# Patient Record
Sex: Female | Born: 1960 | Race: Black or African American | Hispanic: No | Marital: Single | Smoking: Never smoker
Health system: Southern US, Community
[De-identification: ages and names within clinical notes are randomized; demographics above are authoritative.]

## PROBLEM LIST (undated history)

## (undated) DIAGNOSIS — I10 Essential (primary) hypertension: Secondary | ICD-10-CM

## (undated) DIAGNOSIS — E785 Hyperlipidemia, unspecified: Secondary | ICD-10-CM

## (undated) DIAGNOSIS — E119 Type 2 diabetes mellitus without complications: Secondary | ICD-10-CM

## (undated) HISTORY — DX: Type 2 diabetes mellitus without complications: E11.9

## (undated) HISTORY — DX: Essential (primary) hypertension: I10

## (undated) HISTORY — PX: ABDOMINAL HYSTERECTOMY: SHX81

## (undated) HISTORY — DX: Hyperlipidemia, unspecified: E78.5

---

## 1998-02-26 ENCOUNTER — Other Ambulatory Visit: Admission: RE | Admit: 1998-02-26 | Discharge: 1998-02-26 | Payer: Self-pay | Admitting: Gynecology

## 1999-04-17 ENCOUNTER — Other Ambulatory Visit: Admission: RE | Admit: 1999-04-17 | Discharge: 1999-04-17 | Payer: Self-pay | Admitting: Gynecology

## 2001-03-11 ENCOUNTER — Other Ambulatory Visit: Admission: RE | Admit: 2001-03-11 | Discharge: 2001-03-11 | Payer: Self-pay | Admitting: Gynecology

## 2004-09-09 ENCOUNTER — Other Ambulatory Visit: Admission: RE | Admit: 2004-09-09 | Discharge: 2004-09-09 | Payer: Self-pay | Admitting: Gynecology

## 2004-11-01 ENCOUNTER — Ambulatory Visit: Payer: Self-pay | Admitting: Internal Medicine

## 2008-12-06 ENCOUNTER — Encounter (INDEPENDENT_AMBULATORY_CARE_PROVIDER_SITE_OTHER): Payer: Self-pay | Admitting: General Surgery

## 2008-12-06 ENCOUNTER — Ambulatory Visit (HOSPITAL_COMMUNITY): Admission: RE | Admit: 2008-12-06 | Discharge: 2008-12-06 | Payer: Self-pay | Admitting: General Surgery

## 2009-11-27 ENCOUNTER — Emergency Department (HOSPITAL_BASED_OUTPATIENT_CLINIC_OR_DEPARTMENT_OTHER): Admission: EM | Admit: 2009-11-27 | Discharge: 2009-11-27 | Payer: Self-pay | Admitting: Emergency Medicine

## 2010-10-22 LAB — URINALYSIS, ROUTINE W REFLEX MICROSCOPIC
Glucose, UA: NEGATIVE mg/dL
Protein, ur: NEGATIVE mg/dL
Specific Gravity, Urine: 1.03 (ref 1.005–1.030)
pH: 6 (ref 5.0–8.0)

## 2010-11-12 LAB — DIFFERENTIAL
Basophils Absolute: 0 10*3/uL (ref 0.0–0.1)
Basophils Relative: 1 % (ref 0–1)
Eosinophils Relative: 1 % (ref 0–5)
Monocytes Absolute: 0.4 10*3/uL (ref 0.1–1.0)
Neutro Abs: 1.8 10*3/uL (ref 1.7–7.7)

## 2010-11-12 LAB — COMPREHENSIVE METABOLIC PANEL
Albumin: 3.9 g/dL (ref 3.5–5.2)
Alkaline Phosphatase: 126 U/L — ABNORMAL HIGH (ref 39–117)
BUN: 10 mg/dL (ref 6–23)
Chloride: 105 mEq/L (ref 96–112)
Potassium: 4.3 mEq/L (ref 3.5–5.1)
Total Bilirubin: 0.6 mg/dL (ref 0.3–1.2)

## 2010-11-12 LAB — CBC
HCT: 40.2 % (ref 36.0–46.0)
Hemoglobin: 13.9 g/dL (ref 12.0–15.0)
Platelets: 290 10*3/uL (ref 150–400)
RBC: 4.95 MIL/uL (ref 3.87–5.11)
WBC: 3.6 10*3/uL — ABNORMAL LOW (ref 4.0–10.5)
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
## 2012-02-09 ENCOUNTER — Emergency Department (HOSPITAL_BASED_OUTPATIENT_CLINIC_OR_DEPARTMENT_OTHER)
Admission: EM | Admit: 2012-02-09 | Discharge: 2012-02-09 | Disposition: A | Discharge status: Home / Self Care | Payer: Managed Care, Other (non HMO) | Attending: Emergency Medicine | Admitting: Emergency Medicine

## 2012-02-09 ENCOUNTER — Encounter (HOSPITAL_BASED_OUTPATIENT_CLINIC_OR_DEPARTMENT_OTHER): Payer: Self-pay | Admitting: *Deleted

## 2012-02-09 DIAGNOSIS — M545 Low back pain, unspecified: Secondary | ICD-10-CM | POA: Insufficient documentation

## 2012-02-09 DIAGNOSIS — M549 Dorsalgia, unspecified: Secondary | ICD-10-CM

## 2012-02-09 LAB — URINALYSIS, ROUTINE W REFLEX MICROSCOPIC
Bilirubin Urine: NEGATIVE
Ketones, ur: NEGATIVE mg/dL
Nitrite: NEGATIVE
Protein, ur: NEGATIVE mg/dL
Specific Gravity, Urine: 1.009 (ref 1.005–1.030)
Urobilinogen, UA: 0.2 mg/dL (ref 0.0–1.0)

## 2012-02-09 MED ORDER — IBUPROFEN 400 MG PO TABS
600.0000 mg | ORAL_TABLET | Freq: Once | ORAL | Status: AC
  Administered 2012-02-09: 600 mg via ORAL
  Filled 2012-02-09: qty 1

## 2012-02-09 MED ORDER — IBUPROFEN 600 MG PO TABS: 600.0000 mg | ORAL_TABLET | Freq: Four times a day (QID) | ORAL | Status: AC | PRN

## 2012-02-09 MED ORDER — CYCLOBENZAPRINE HCL 10 MG PO TABS: 10.0000 mg | ORAL_TABLET | Freq: Two times a day (BID) | ORAL | Status: AC | PRN

## 2012-02-09 NOTE — ED Notes (Signed)
Pt amb to room 9 with quick steady gait in nad. Pt reports frequency and left sided flank pain x Saturday, worsening since then. Pt states "either I have a bladder infection or I hurt it at the gym..." pt denies any fevers or other c/o.

## 2012-02-09 NOTE — ED Provider Notes (Signed)
History     CSN: [id number]  Arrival date & time 02/09/12  0809   First MD Initiated Contact with Patient 02/09/12 [phone]      Chief Complaint  Patient presents with  . Back Pain    (Consider location/radiation/quality/duration/timing/severity/associated sxs/prior treatment) HPI  50yoF previously healthy pw back pain. Started 3 days ago. Describes pain 8/10 left lower back. No relief with cranberry juice. Worse with moving around, bending over. Denies radiation of pain. Denies history of recent trauma/falls. Denies h/o malignancy, DM, immunocompromise  injection drug use, immunosuppression, indwelling urinary catheter, prolonged steroid use, skin or urinary tract infection. No numbness/tingling/weakness of extremities. Denies fever/chills. Denies saddle anesthesia, no urinary incontinence or retention. She is urinating more frequently than usual and "feels like it smells strong" no hematuria.  Denies rash. States she began to lifting weights including back exercises one month ago. Ambulatory with nl gait. Denies abd pain/n/v  ED Notes, ED Provider Notes from 02/09/12 0000 to 02/09/12 08:28:05       Amy Theotis Barrio, RN 02/09/2012 08:27      Pt amb to room 9 with quick steady gait in nad. Pt reports frequency and left sided flank pain x Saturday, worsening since then. Pt states "either I have a bladder infection or I hurt it at the gym..." pt denies any fevers or other c/o.    History reviewed. No pertinent past medical history.  History reviewed. No pertinent past surgical history.  History reviewed. No pertinent family history.  History  Substance Use Topics  . Smoking status: Not on file  . Smokeless tobacco: Not on file  . Alcohol Use: Not on file    OB History    Grav Para Term Preterm Abortions TAB SAB Ect Mult Living                  Review of Systems  All other systems reviewed and are negative.  except as noted HPI   Allergies  Review of patient's allergies indicates no  known allergies.  Home Medications   Current Outpatient Rx  Name Route Sig Dispense Refill  . CYCLOBENZAPRINE HCL 10 MG PO TABS Oral Take 1 tablet (10 mg total) by mouth 2 (two) times daily as needed for muscle spasms. 20 tablet 0  . IBUPROFEN 600 MG PO TABS Oral Take 1 tablet (600 mg total) by mouth every 6 (six) hours as needed for pain. 30 tablet 0    BP 115/82  Pulse 74  Temp 97.9 F (36.6 C) (Oral)  Resp 20  SpO2 100%  Physical Exam  Nursing note and vitals reviewed. Constitutional: She is oriented to person, place, and time. She appears well-developed.  HENT:  Head: Atraumatic.  Mouth/Throat: Oropharynx is clear and moist.  Eyes: Conjunctivae and EOM are normal. Pupils are equal, round, and reactive to light.  Neck: Normal range of motion. Neck supple.  Cardiovascular: Normal rate, regular rhythm, normal heart sounds and intact distal pulses.   Pulmonary/Chest: Effort normal and breath sounds normal. No respiratory distress. She has no wheezes. She has no rales.  Abdominal: Soft. She exhibits no distension. There is no tenderness. There is no rebound and no guarding.  Musculoskeletal: Normal range of motion. She exhibits no edema and no tenderness.       Strength 5/5 b/l LE No saddle anesthesia No midline c/t/l/s ttp Straight leg raise neg b/l   Neurological: She is alert and oriented to person, place, and time.  Skin: Skin is warm and  dry. No rash noted.  Psychiatric: She has a normal mood and affect.    ED Course  Procedures (including critical care time)   Labs Reviewed  URINALYSIS, ROUTINE W REFLEX MICROSCOPIC   No results found.  1. Back pain    MDM   Likely msk back pain from inc exercise/wt lifting. Neuro intact. U/A unremarkable. No EMC precluding discharge at this time. Given Precautions for return. PMD f/u.       Forbes Cellar, MD 02/09/12 0900

## 2015-07-14 ENCOUNTER — Encounter (HOSPITAL_BASED_OUTPATIENT_CLINIC_OR_DEPARTMENT_OTHER): Payer: Self-pay | Admitting: Emergency Medicine

## 2015-07-14 ENCOUNTER — Emergency Department (HOSPITAL_BASED_OUTPATIENT_CLINIC_OR_DEPARTMENT_OTHER)
Admission: EM | Admit: 2015-07-14 | Discharge: 2015-07-15 | Disposition: A | Discharge status: Home / Self Care | Payer: BLUE CROSS/BLUE SHIELD | Attending: Emergency Medicine | Admitting: Emergency Medicine

## 2015-07-14 DIAGNOSIS — R8299 Other abnormal findings in urine: Secondary | ICD-10-CM | POA: Insufficient documentation

## 2015-07-14 DIAGNOSIS — M545 Low back pain, unspecified: Secondary | ICD-10-CM

## 2015-07-14 DIAGNOSIS — R202 Paresthesia of skin: Secondary | ICD-10-CM | POA: Diagnosis not present

## 2015-07-14 DIAGNOSIS — Z87440 Personal history of urinary (tract) infections: Secondary | ICD-10-CM | POA: Insufficient documentation

## 2015-07-14 DIAGNOSIS — R350 Frequency of micturition: Secondary | ICD-10-CM | POA: Insufficient documentation

## 2015-07-14 LAB — URINALYSIS, ROUTINE W REFLEX MICROSCOPIC
Bilirubin Urine: NEGATIVE
GLUCOSE, UA: NEGATIVE mg/dL
HGB URINE DIPSTICK: NEGATIVE
Ketones, ur: NEGATIVE mg/dL
LEUKOCYTES UA: NEGATIVE
Nitrite: NEGATIVE
PROTEIN: NEGATIVE mg/dL
SPECIFIC GRAVITY, URINE: 1.015 (ref 1.005–1.030)
pH: 6.5 (ref 5.0–8.0)

## 2015-07-14 MED ORDER — NAPROXEN 500 MG PO TABS: 500.0000 mg | ORAL_TABLET | Freq: Two times a day (BID) | ORAL | Status: DC

## 2015-07-14 MED ORDER — HYDROCODONE-ACETAMINOPHEN 5-325 MG PO TABS: 1.0000 | ORAL_TABLET | Freq: Four times a day (QID) | ORAL | Status: DC | PRN

## 2015-07-14 MED ORDER — METHOCARBAMOL 500 MG PO TABS: 500.0000 mg | ORAL_TABLET | Freq: Two times a day (BID) | ORAL | Status: DC

## 2015-07-14 MED ORDER — METHOCARBAMOL 500 MG PO TABS
1000.0000 mg | ORAL_TABLET | Freq: Once | ORAL | Status: AC
  Administered 2015-07-14: 1000 mg via ORAL
  Filled 2015-07-14: qty 2

## 2015-07-14 MED ORDER — HYDROCODONE-ACETAMINOPHEN 5-325 MG PO TABS
1.0000 | ORAL_TABLET | Freq: Once | ORAL | Status: AC
  Administered 2015-07-14: 1 via ORAL
  Filled 2015-07-14: qty 1

## 2015-07-14 NOTE — Discharge Instructions (Signed)
1. Medications: robaxin, naproxyn, vicodin, usual home medications 2. Treatment: rest, drink plenty of fluids, gentle stretching as discussed, alternate ice and heat 3. Follow Up: Please followup with your primary doctor in 3 days for discussion of your diagnoses and further evaluation after today's visit; if you do not have a primary care doctor use the resource guide provided to find one;  Return to the ER for worsening back pain, difficulty walking, loss of bowel or bladder control or other concerning symptoms     Back Exercises The following exercises strengthen the muscles that help to support the back. They also help to keep the lower back flexible. Doing these exercises can help to prevent back pain or lessen existing pain. If you have back pain or discomfort, try doing these exercises 2-3 times each day or as told by your health care provider. When the pain goes away, do them once each day, but increase the number of times that you repeat the steps for each exercise (do more repetitions). If you do not have back pain or discomfort, do these exercises once each day or as told by your health care provider. EXERCISES Single Knee to Chest Repeat these steps 3-5 times for each leg: 1. Lie on your back on a firm bed or the floor with your legs extended. 2. Bring one knee to your chest. Your other leg should stay extended and in contact with the floor. 3. Hold your knee in place by grabbing your knee or thigh. 4. Pull on your knee until you feel a gentle stretch in your lower back. 5. Hold the stretch for 10-30 seconds. 6. Slowly release and straighten your leg. Pelvic Tilt Repeat these steps 5-10 times: 1. Lie on your back on a firm bed or the floor with your legs extended. 2. Bend your knees so they are pointing toward the ceiling and your feet are flat on the floor. 3. Tighten your lower abdominal muscles to press your lower back against the floor. This motion will tilt your pelvis so your  tailbone points up toward the ceiling instead of pointing to your feet or the floor. 4. With gentle tension and even breathing, hold this position for 5-10 seconds. Cat-Cow Repeat these steps until your lower back becomes more flexible: 1. Get into a hands-and-knees position on a firm surface. Keep your hands under your shoulders, and keep your knees under your hips. You may place padding under your knees for comfort. 2. Let your head hang down, and point your tailbone toward the floor so your lower back becomes rounded like the back of a cat. 3. Hold this position for 5 seconds. 4. Slowly lift your head and point your tailbone up toward the ceiling so your back forms a sagging arch like the back of a cow. 5. Hold this position for 5 seconds. Press-Ups Repeat these steps 5-10 times: 1. Lie on your abdomen (face-down) on the floor. 2. Place your palms near your head, about shoulder-width apart. 3. While you keep your back as relaxed as possible and keep your hips on the floor, slowly straighten your arms to raise the top half of your body and lift your shoulders. Do not use your back muscles to raise your upper torso. You may adjust the placement of your hands to make yourself more comfortable. 4. Hold this position for 5 seconds while you keep your back relaxed. 5. Slowly return to lying flat on the floor. Bridges Repeat these steps 10 times: 1. Lie on  your back on a firm surface. 2. Bend your knees so they are pointing toward the ceiling and your feet are flat on the floor. 3. Tighten your buttocks muscles and lift your buttocks off of the floor until your waist is at almost the same height as your knees. You should feel the muscles working in your buttocks and the back of your thighs. If you do not feel these muscles, slide your feet 1-2 inches farther away from your buttocks. 4. Hold this position for 3-5 seconds. 5. Slowly lower your hips to the starting position, and allow your buttocks  muscles to relax completely. If this exercise is too easy, try doing it with your arms crossed over your chest. Abdominal Crunches Repeat these steps 5-10 times: 1. Lie on your back on a firm bed or the floor with your legs extended. 2. Bend your knees so they are pointing toward the ceiling and your feet are flat on the floor. 3. Cross your arms over your chest. 4. Tip your chin slightly toward your chest without bending your neck. 5. Tighten your abdominal muscles and slowly raise your trunk (torso) high enough to lift your shoulder blades a tiny bit off of the floor. Avoid raising your torso higher than that, because it can put too much stress on your low back and it does not help to strengthen your abdominal muscles. 6. Slowly return to your starting position. Back Lifts Repeat these steps 5-10 times: 1. Lie on your abdomen (face-down) with your arms at your sides, and rest your forehead on the floor. 2. Tighten the muscles in your legs and your buttocks. 3. Slowly lift your chest off of the floor while you keep your hips pressed to the floor. Keep the back of your head in line with the curve in your back. Your eyes should be looking at the floor. 4. Hold this position for 3-5 seconds. 5. Slowly return to your starting position. SEEK MEDICAL CARE IF:  Your back pain or discomfort gets much worse when you do an exercise.  Your back pain or discomfort does not lessen within 2 hours after you exercise. If you have any of these problems, stop doing these exercises right away. Do not do them again unless your health care provider says that you can. SEEK IMMEDIATE MEDICAL CARE IF:  You develop sudden, severe back pain. If this happens, stop doing the exercises right away. Do not do them again unless your health care provider says that you can.   This information is not intended to replace advice given to you by your health care provider. Make sure you discuss any questions you have with your  health care provider.   Document Released: 08/28/2004 Document Revised: 04/11/2015 Document Reviewed: 09/14/2014 Elsevier Interactive Patient Education [employer].

## 2015-07-14 NOTE — ED Notes (Signed)
Pt reports left lower back pain for about 10 days, worse last night.  No known injury.

## 2015-07-14 NOTE — ED Provider Notes (Signed)
CSN: [id number]     Arrival date & time 07/14/15  1638 History   First MD Initiated Contact with Patient 07/14/15 1656     Chief Complaint  Patient presents with  . Back Pain     (Consider location/radiation/quality/duration/timing/severity/associated sxs/prior Treatment) The history is provided by the patient and medical records. No language interpreter was used.     Donna Klein is a 54 y.o. female  with a hx of abdominal hysterectomy presents to the Emergency Department complaining of gradual, persistent, progressively worsening 5/10 left lower back pain onset 10 days ago. Patient reports that her pain is worse at night when she is trying to sleep. He denies falls or known trauma. She denies night sweats, weight loss, IV drug use, anticoagulant usage or history of cancer. She reports that moving from sitting to standing makes the pain worse. Lying still makes the pain somewhat better. Patient reports that previously she has had back pain with UTIs however she denies urinary or hematuria. She does endorse some frequency and a dark color to her urine. She denies malodorous urine. Patient denies vaginal discharge. Patient reports taking Aleve with no relief. Patient reports an associated small area of paresthesia to the dorsum of the right foot without loss of sensation, foot drop, gait disturbance or paresthesia anywhere else in her legs.  Patient denies saddle anesthesia, loss of bowel or bladder control, radiation of her pain.  History reviewed. No pertinent past medical history. Past Surgical History  Procedure Laterality Date  . Abdominal hysterectomy     No family history on file. Social History  Substance Use Topics  . Smoking status: Never Smoker   . Smokeless tobacco: None  . Alcohol Use: No   OB History    No data available     Review of Systems  Constitutional: Negative for fever and fatigue.  Respiratory: Negative for chest tightness and shortness of breath.    Cardiovascular: Negative for chest pain.  Gastrointestinal: Negative for nausea, vomiting, abdominal pain and diarrhea.  Genitourinary: Negative for dysuria, urgency, frequency and hematuria.  Musculoskeletal: Positive for back pain. Negative for joint swelling, gait problem, neck pain and neck stiffness.  Skin: Negative for rash.  Neurological: Negative for weakness, light-headedness, numbness and headaches.  All other systems reviewed and are negative.     Allergies  Review of patient's allergies indicates no known allergies.  Home Medications   Prior to Admission medications   Medication Sig Start Date End Date Taking? Authorizing Provider  HYDROcodone-acetaminophen (NORCO/VICODIN) 5-325 MG tablet Take 1 tablet by mouth every 6 (six) hours as needed for moderate pain or severe pain. 07/14/15   Nakyra Bourn, PA-C  methocarbamol (ROBAXIN) 500 MG tablet Take 1 tablet (500 mg total) by mouth 2 (two) times daily. 07/14/15   Keziah Avis, PA-C  naproxen (NAPROSYN) 500 MG tablet Take 1 tablet (500 mg total) by mouth 2 (two) times daily with a meal. 07/14/15   Uriel Dowding, PA-C   BP 142/96 mmHg  Pulse 81  Temp(Src) 98 F (36.7 C) (Oral)  Resp 18  Ht 5\' 5"  (1.651 m)  Wt 73.483 kg  BMI 26.96 kg/m2  SpO2 100% Physical Exam  Constitutional: She appears well-developed and well-nourished. No distress.  HENT:  Head: Normocephalic and atraumatic.  Mouth/Throat: Oropharynx is clear and moist. No oropharyngeal exudate.  Eyes: Conjunctivae are normal.  Neck: Normal range of motion. Neck supple.  Full ROM without pain  Cardiovascular: Normal rate, regular rhythm, normal heart sounds and intact  distal pulses.   Pulmonary/Chest: Effort normal and breath sounds normal. No respiratory distress. She has no wheezes.  Abdominal: Soft. Bowel sounds are normal. She exhibits no distension and no mass. There is no tenderness.  No pulsatile mass  Musculoskeletal:  Full range of  motion of the T-spine and L-spine No tenderness to palpation of the spinous processes of the T-spine or L-spine Tenderness to palpation of the paraspinous muscles of the L-spine Patient is point tender over the left SI joint  Lymphadenopathy:    She has no cervical adenopathy.  Neurological: She is alert. She has normal reflexes.  Reflex Scores:      Bicep reflexes are 2+ on the right side and 2+ on the left side.      Brachioradialis reflexes are 2+ on the right side and 2+ on the left side.      Patellar reflexes are 2+ on the right side and 2+ on the left side.      Achilles reflexes are 2+ on the right side and 2+ on the left side. Speech is clear and goal oriented, follows commands Normal 5/5 strength in upper and lower extremities bilaterally including dorsiflexion and plantar flexion, strong and equal grip strength Sensation normal to light and sharp touch Moves extremities without ataxia, coordination intact Normal gait Normal balance No Clonus   Skin: Skin is warm and dry. No rash noted. She is not diaphoretic. No erythema.  Psychiatric: She has a normal mood and affect. Her behavior is normal.  Nursing note and vitals reviewed.   ED Course  Procedures (including critical care time) Labs Review Labs Reviewed  URINALYSIS, ROUTINE W REFLEX MICROSCOPIC (NOT AT [facility])    Imaging Review No results found. I have personally reviewed and evaluated these images and lab results as part of my medical decision-making.   EKG Interpretation None      MDM   Final diagnoses:  Left-sided low back pain without sciatica   Milus Height with nontraumatic, left lower back pain.  Patient with back pain.  No neurological deficits and normal neuro exam.  Patient can walk but states is painful.  No loss of bowel or bladder control.  No concern for cauda equina.  No fever, night sweats, weight loss, h/o cancer, IVDU.  No pulsatile abdominal masses. UA without evidence of urinary  tract infection. RICE protocol and pain medicine indicated and discussed with patient. Recommend follow-up with primary care within 48 hours.   BP 142/96 mmHg  Pulse 81  Temp(Src) 98 F (36.7 C) (Oral)  Resp 18  Ht 5\' 5"  (1.651 m)  Wt 73.483 kg  BMI 26.96 kg/m2  SpO2 100%   Abigail Butts, PA-C 07/14/15 [place], MD 07/14/15 [phone]

## 2016-05-12 ENCOUNTER — Ambulatory Visit (INDEPENDENT_AMBULATORY_CARE_PROVIDER_SITE_OTHER): Payer: BLUE CROSS/BLUE SHIELD | Admitting: Physician Assistant

## 2016-05-12 VITALS — BP 134/76 | HR 84 | Temp 98.0°F | Resp 16 | Ht 64.0 in | Wt 177.0 lb

## 2016-05-12 DIAGNOSIS — B9689 Other specified bacterial agents as the cause of diseases classified elsewhere: Secondary | ICD-10-CM | POA: Diagnosis not present

## 2016-05-12 DIAGNOSIS — N898 Other specified noninflammatory disorders of vagina: Secondary | ICD-10-CM

## 2016-05-12 DIAGNOSIS — R350 Frequency of micturition: Secondary | ICD-10-CM

## 2016-05-12 DIAGNOSIS — M25511 Pain in right shoulder: Secondary | ICD-10-CM | POA: Diagnosis not present

## 2016-05-12 DIAGNOSIS — N760 Acute vaginitis: Secondary | ICD-10-CM | POA: Diagnosis not present

## 2016-05-12 LAB — POCT URINALYSIS DIP (MANUAL ENTRY)
BILIRUBIN UA: NEGATIVE
GLUCOSE UA: NEGATIVE
Leukocytes, UA: NEGATIVE
Nitrite, UA: NEGATIVE
PH UA: 6
RBC UA: NEGATIVE
Spec Grav, UA: 1.025
Urobilinogen, UA: 0.2

## 2016-05-12 LAB — POCT WET + KOH PREP
TRICH BY WET PREP: ABSENT
YEAST BY KOH: ABSENT
YEAST BY WET PREP: ABSENT

## 2016-05-12 LAB — POC MICROSCOPIC URINALYSIS (UMFC): Mucus: ABSENT

## 2016-05-12 MED ORDER — MELOXICAM 15 MG PO TABS
15.0000 mg | ORAL_TABLET | Freq: Every day | ORAL | 1 refills | Status: DC
Start: 2016-05-12 — End: 2018-09-18

## 2016-05-12 MED ORDER — CYCLOBENZAPRINE HCL 5 MG PO TABS: 5.0000 mg | ORAL_TABLET | Freq: Three times a day (TID) | ORAL | 0 refills | Status: DC | PRN

## 2016-05-12 MED ORDER — METRONIDAZOLE 500 MG PO TABS: 500.0000 mg | ORAL_TABLET | Freq: Two times a day (BID) | ORAL | 0 refills | Status: AC

## 2016-05-12 NOTE — Patient Instructions (Addendum)
Take flagyl for BV, do not drink alcohol while on this medication.  Use meloxicam and flexeril for shoulder. Ice 4-5 times a day. Follow up in one week if pain is still present. Avoid excessive use of the shoulder. Begin exercises once pain has resolved.   Impingement Syndrome, Rotator Cuff, Bursitis With Rehab Impingement syndrome is a condition that involves inflammation of the tendons of the rotator cuff and the subacromial bursa, that causes pain in the shoulder. The rotator cuff consists of four tendons and muscles that control much of the shoulder and upper arm function. The subacromial bursa is a fluid filled sac that helps reduce friction between the rotator cuff and one of the bones of the shoulder (acromion). Impingement syndrome is usually an overuse injury that causes swelling of the bursa (bursitis), swelling of the tendon (tendonitis), and/or a tear of the tendon (strain). Strains are classified into three categories. Grade 1 strains cause pain, but the tendon is not lengthened. Grade 2 strains include a lengthened ligament, due to the ligament being stretched or partially ruptured. With grade 2 strains there is still function, although the function may be decreased. Grade 3 strains include a complete tear of the tendon or muscle, and function is usually impaired. SYMPTOMS   Pain around the shoulder, often at the outer portion of the upper arm.  Pain that gets worse with shoulder function, especially when reaching overhead or lifting.  Sometimes, aching when not using the arm.  Pain that wakes you up at night.  Sometimes, tenderness, swelling, warmth, or redness over the affected area.  Loss of strength.  Limited motion of the shoulder, especially reaching behind the back (to the back pocket or to unhook bra) or across your body.  Crackling sound (crepitation) when moving the arm.  Biceps tendon pain and inflammation (in the front of the shoulder). Worse when bending the elbow  or lifting. CAUSES  Impingement syndrome is often an overuse injury, in which chronic (repetitive) motions cause the tendons or bursa to become inflamed. A strain occurs when a force is paced on the tendon or muscle that is greater than it can withstand. Common mechanisms of injury include: Stress from sudden increase in duration, frequency, or intensity of training.  Direct hit (trauma) to the shoulder.  Aging, erosion of the tendon with normal use.  Bony bump on shoulder (acromial spur). RISK INCREASES WITH:  Contact sports (football, wrestling, boxing).  Throwing sports (baseball, tennis, volleyball).  Weightlifting and bodybuilding.  Heavy labor.  Previous injury to the rotator cuff, including impingement.  Poor shoulder strength and flexibility.  Failure to warm up properly before activity.  Inadequate protective equipment.  Old age.  Bony bump on shoulder (acromial spur). PREVENTION   Warm up and stretch properly before activity.  Allow for adequate recovery between workouts.  Maintain physical fitness:  Strength, flexibility, and endurance.  Cardiovascular fitness.  Learn and use proper exercise technique. PROGNOSIS  If treated properly, impingement syndrome usually goes away within 6 weeks. Sometimes surgery is required.  RELATED COMPLICATIONS   Longer healing time if not properly treated, or if not given enough time to heal.  Recurring symptoms, that result in a chronic condition.  Shoulder stiffness, frozen shoulder, or loss of motion.  Rotator cuff tendon tear.  Recurring symptoms, especially if activity is resumed too soon, with overuse, with a direct blow, or when using poor technique. TREATMENT  Treatment first involves the use of ice and medicine, to reduce pain and inflammation. The  use of strengthening and stretching exercises may help reduce pain with activity. These exercises may be performed at home or with a therapist. If non-surgical  treatment is unsuccessful after more than 6 months, surgery may be advised. After surgery and rehabilitation, activity is usually possible in 3 months.  MEDICATION  If pain medicine is needed, nonsteroidal anti-inflammatory medicines (aspirin and ibuprofen), or other minor pain relievers (acetaminophen), are often advised.  Do not take pain medicine for 7 days before surgery.  Prescription pain relievers may be given, if your caregiver thinks they are needed. Use only as directed and only as much as you need.  Corticosteroid injections may be given by your caregiver. These injections should be reserved for the most serious cases, because they may only be given a certain number of times. HEAT AND COLD  Cold treatment (icing) should be applied for 10 to 15 minutes every 2 to 3 hours for inflammation and pain, and immediately after activity that aggravates your symptoms. Use ice packs or an ice massage.  Heat treatment may be used before performing stretching and strengthening activities prescribed by your caregiver, physical therapist, or athletic trainer. Use a heat pack or a warm water soak. SEEK MEDICAL CARE IF:   Symptoms get worse or do not improve in 4 to 6 weeks, despite treatment.  New, unexplained symptoms develop. (Drugs used in treatment may produce side effects.) EXERCISES  RANGE OF MOTION (ROM) AND STRETCHING EXERCISES - Impingement Syndrome (Rotator Cuff  Tendinitis, Bursitis) These exercises may help you when beginning to rehabilitate your injury. Your symptoms may go away with or without further involvement from your physician, physical therapist or athletic trainer. While completing these exercises, remember:   Restoring tissue flexibility helps normal motion to return to the joints. This allows healthier, less painful movement and activity.  An effective stretch should be held for at least 30 seconds.  A stretch should never be painful. You should only feel a gentle  lengthening or release in the stretched tissue. STRETCH - Flexion, Standing  Stand with good posture. With an underhand grip on your right / left hand, and an overhand grip on the opposite hand, grasp a broomstick or cane so that your hands are a little more than shoulder width apart.  Keeping your right / left elbow straight and shoulder muscles relaxed, push the stick with your opposite hand, to raise your right / left arm in front of your body and then overhead. Raise your arm until you feel a stretch in your right / left shoulder, but before you have increased shoulder pain.  Try to avoid shrugging your right / left shoulder as your arm rises, by keeping your shoulder blade tucked down and toward your mid-back spine. Hold for __________ seconds.  Slowly return to the starting position. Repeat __________ times. Complete this exercise __________ times per day. STRETCH - Abduction, Supine  Lie on your back. With an underhand grip on your right / left hand and an overhand grip on the opposite hand, grasp a broomstick or cane so that your hands are a little more than shoulder width apart.  Keeping your right / left elbow straight and your shoulder muscles relaxed, push the stick with your opposite hand, to raise your right / left arm out to the side of your body and then overhead. Raise your arm until you feel a stretch in your right / left shoulder, but before you have increased shoulder pain.  Try to avoid shrugging your right /  left shoulder as your arm rises, by keeping your shoulder blade tucked down and toward your mid-back spine. Hold for __________ seconds.  Slowly return to the starting position. Repeat __________ times. Complete this exercise __________ times per day. ROM - Flexion, Active-Assisted  Lie on your back. You may bend your knees for comfort.  Grasp a broomstick or cane so your hands are about shoulder width apart. Your right / left hand should grip the end of the stick,  so that your hand is positioned "thumbs-up," as if you were about to shake hands.  Using your healthy arm to lead, raise your right / left arm overhead, until you feel a gentle stretch in your shoulder. Hold for __________ seconds.  Use the stick to assist in returning your right / left arm to its starting position. Repeat __________ times. Complete this exercise __________ times per day.  ROM - Internal Rotation, Supine   Lie on your back on a firm surface. Place your right / left elbow about 60 degrees away from your side. Elevate your elbow with a folded towel, so that the elbow and shoulder are the same height.  Using a broomstick or cane and your strong arm, pull your right / left hand toward your body until you feel a gentle stretch, but no increase in your shoulder pain. Keep your shoulder and elbow in place throughout the exercise.  Hold for __________ seconds. Slowly return to the starting position. Repeat __________ times. Complete this exercise __________ times per day. STRETCH - Internal Rotation  Place your right / left hand behind your back, palm up.  Throw a towel or belt over your opposite shoulder. Grasp the towel with your right / left hand.  While keeping an upright posture, gently pull up on the towel, until you feel a stretch in the front of your right / left shoulder.  Avoid shrugging your right / left shoulder as your arm rises, by keeping your shoulder blade tucked down and toward your mid-back spine.  Hold for __________ seconds. Release the stretch, by lowering your healthy hand. Repeat __________ times. Complete this exercise __________ times per day. ROM - Internal Rotation   Using an underhand grip, grasp a stick behind your back with both hands.  While standing upright with good posture, slide the stick up your back until you feel a mild stretch in the front of your shoulder.  Hold for __________ seconds. Slowly return to your starting position. Repeat  __________ times. Complete this exercise __________ times per day.  STRETCH - Posterior Shoulder Capsule   Stand or sit with good posture. Grasp your right / left elbow and draw it across your chest, keeping it at the same height as your shoulder.  Pull your elbow, so your upper arm comes in closer to your chest. Pull until you feel a gentle stretch in the back of your shoulder.  Hold for __________ seconds. Repeat __________ times. Complete this exercise __________ times per day. STRENGTHENING EXERCISES - Impingement Syndrome (Rotator Cuff Tendinitis, Bursitis) These exercises may help you when beginning to rehabilitate your injury. They may resolve your symptoms with or without further involvement from your physician, physical therapist or athletic trainer. While completing these exercises, remember:  Muscles can gain both the endurance and the strength needed for everyday activities through controlled exercises.  Complete these exercises as instructed by your physician, physical therapist or athletic trainer. Increase the resistance and repetitions only as guided.  You may experience muscle soreness or  fatigue, but the pain or discomfort you are trying to eliminate should never worsen during these exercises. If this pain does get worse, stop and make sure you are following the directions exactly. If the pain is still present after adjustments, discontinue the exercise until you can discuss the trouble with your clinician.  During your recovery, avoid activity or exercises which involve actions that place your injured hand or elbow above your head or behind your back or head. These positions stress the tissues which you are trying to heal. STRENGTH - Scapular Depression and Adduction   With good posture, sit on a firm chair. Support your arms in front of you, with pillows, arm rests, or on a table top. Have your elbows in line with the sides of your body.  Gently draw your shoulder blades  down and toward your mid-back spine. Gradually increase the tension, without tensing the muscles along the top of your shoulders and the back of your neck.  Hold for __________ seconds. Slowly release the tension and relax your muscles completely before starting the next repetition.  After you have practiced this exercise, remove the arm support and complete the exercise in standing as well as sitting position. Repeat __________ times. Complete this exercise __________ times per day.  STRENGTH - Shoulder Abductors, Isometric  With good posture, stand or sit about 4-6 inches from a wall, with your right / left side facing the wall.  Bend your right / left elbow. Gently press your right / left elbow into the wall. Increase the pressure gradually, until you are pressing as hard as you can, without shrugging your shoulder or increasing any shoulder discomfort.  Hold for __________ seconds.  Release the tension slowly. Relax your shoulder muscles completely before you begin the next repetition. Repeat __________ times. Complete this exercise __________ times per day.  STRENGTH - External Rotators, Isometric  Keep your right / left elbow at your side and bend it 90 degrees.  Step into a door frame so that the outside of your right / left wrist can press against the door frame without your upper arm leaving your side.  Gently press your right / left wrist into the door frame, as if you were trying to swing the back of your hand away from your stomach. Gradually increase the tension, until you are pressing as hard as you can, without shrugging your shoulder or increasing any shoulder discomfort.  Hold for __________ seconds.  Release the tension slowly. Relax your shoulder muscles completely before you begin the next repetition. Repeat __________ times. Complete this exercise __________ times per day.  STRENGTH - Supraspinatus   Stand or sit with good posture. Grasp a __________ weight, or an  exercise band or tubing, so that your hand is "thumbs-up," like you are shaking hands.  Slowly lift your right / left arm in a "V" away from your thigh, diagonally into the space between your side and straight ahead. Lift your hand to shoulder height or as far as you can, without increasing any shoulder pain. At first, many people do not lift their hands above shoulder height.  Avoid shrugging your right / left shoulder as your arm rises, by keeping your shoulder blade tucked down and toward your mid-back spine.  Hold for __________ seconds. Control the descent of your hand, as you slowly return to your starting position. Repeat __________ times. Complete this exercise __________ times per day.  STRENGTH - External Rotators  Secure a rubber exercise band or  tubing to a fixed object (table, pole) so that it is at the same height as your right / left elbow when you are standing or sitting on a firm surface.  Stand or sit so that the secured exercise band is at your uninjured side.  Bend your right / left elbow 90 degrees. Place a folded towel or small pillow under your right / left arm, so that your elbow is a few inches away from your side.  Keeping the tension on the exercise band, pull it away from your body, as if pivoting on your elbow. Be sure to keep your body steady, so that the movement is coming only from your rotating shoulder.  Hold for __________ seconds. Release the tension in a controlled manner, as you return to the starting position. Repeat __________ times. Complete this exercise __________ times per day.  STRENGTH - Internal Rotators   Secure a rubber exercise band or tubing to a fixed object (table, pole) so that it is at the same height as your right / left elbow when you are standing or sitting on a firm surface.  Stand or sit so that the secured exercise band is at your right / left side.  Bend your elbow 90 degrees. Place a folded towel or small pillow under your right  / left arm so that your elbow is a few inches away from your side.  Keeping the tension on the exercise band, pull it across your body, toward your stomach. Be sure to keep your body steady, so that the movement is coming only from your rotating shoulder.  Hold for __________ seconds. Release the tension in a controlled manner, as you return to the starting position. Repeat __________ times. Complete this exercise __________ times per day.  STRENGTH - Scapular Protractors, Standing   Stand arms length away from a wall. Place your hands on the wall, keeping your elbows straight.  Begin by dropping your shoulder blades down and toward your mid-back spine.  To strengthen your protractors, keep your shoulder blades down, but slide them forward on your rib cage. It will feel as if you are lifting the back of your rib cage away from the wall. This is a subtle motion and can be challenging to complete. Ask your caregiver for further instruction, if you are not sure you are doing the exercise correctly.  Hold for __________ seconds. Slowly return to the starting position, resting the muscles completely before starting the next repetition. Repeat __________ times. Complete this exercise __________ times per day. STRENGTH - Scapular Protractors, Supine  Lie on your back on a firm surface. Extend your right / left arm straight into the air while holding a __________ weight in your hand.  Keeping your head and back in place, lift your shoulder off the floor.  Hold for __________ seconds. Slowly return to the starting position, and allow your muscles to relax completely before starting the next repetition. Repeat __________ times. Complete this exercise __________ times per day. STRENGTH - Scapular Protractors, Quadruped  Get onto your hands and knees, with your shoulders directly over your hands (or as close as you can be, comfortably).  Keeping your elbows locked, lift the back of your rib cage up  into your shoulder blades, so your mid-back rounds out. Keep your neck muscles relaxed.  Hold this position for __________ seconds. Slowly return to the starting position and allow your muscles to relax completely before starting the next repetition. Repeat __________ times. Complete this exercise  __________ times per day.  STRENGTH - Scapular Retractors  Secure a rubber exercise band or tubing to a fixed object (table, pole), so that it is at the height of your shoulders when you are either standing, or sitting on a firm armless chair.  With a palm down grip, grasp an end of the band in each hand. Straighten your elbows and lift your hands straight in front of you, at shoulder height. Step back, away from the secured end of the band, until it becomes tense.  Squeezing your shoulder blades together, draw your elbows back toward your sides, as you bend them. Keep your upper arms lifted away from your body throughout the exercise.  Hold for __________ seconds. Slowly ease the tension on the band, as you reverse the directions and return to the starting position. Repeat __________ times. Complete this exercise __________ times per day. STRENGTH - Shoulder Extensors   Secure a rubber exercise band or tubing to a fixed object (table, pole) so that it is at the height of your shoulders when you are either standing, or sitting on a firm armless chair.  With a thumbs-up grip, grasp an end of the band in each hand. Straighten your elbows and lift your hands straight in front of you, at shoulder height. Step back, away from the secured end of the band, until it becomes tense.  Squeezing your shoulder blades together, pull your hands down to the sides of your thighs. Do not allow your hands to go behind you.  Hold for __________ seconds. Slowly ease the tension on the band, as you reverse the directions and return to the starting position. Repeat __________ times. Complete this exercise __________ times  per day.  STRENGTH - Scapular Retractors and External Rotators   Secure a rubber exercise band or tubing to a fixed object (table, pole) so that it is at the height as your shoulders, when you are either standing, or sitting on a firm armless chair.  With a palm down grip, grasp an end of the band in each hand. Bend your elbows 90 degrees and lift your elbows to shoulder height, at your sides. Step back, away from the secured end of the band, until it becomes tense.  Squeezing your shoulder blades together, rotate your shoulders so that your upper arms and elbows remain stationary, but your fists travel upward to head height.  Hold for __________ seconds. Slowly ease the tension on the band, as you reverse the directions and return to the starting position. Repeat __________ times. Complete this exercise __________ times per day.  STRENGTH - Scapular Retractors and External Rotators, Rowing   Secure a rubber exercise band or tubing to a fixed object (table, pole) so that it is at the height of your shoulders, when you are either standing, or sitting on a firm armless chair.  With a palm down grip, grasp an end of the band in each hand. Straighten your elbows and lift your hands straight in front of you, at shoulder height. Step back, away from the secured end of the band, until it becomes tense.  Step 1: Squeeze your shoulder blades together. Bending your elbows, draw your hands to your chest, as if you are rowing a boat. At the end of this motion, your hands and elbow should be at shoulder height and your elbows should be out to your sides.  Step 2: Rotate your shoulders, to raise your hands above your head. Your forearms should be vertical and your  upper arms should be horizontal.  Hold for __________ seconds. Slowly ease the tension on the band, as you reverse the directions and return to the starting position. Repeat __________ times. Complete this exercise __________ times per day.   STRENGTH - Scapular Depressors  Find a sturdy chair without wheels, such as a dining room chair.  Keeping your feet on the floor, and your hands on the chair arms, lift your bottom up from the seat, and lock your elbows.  Keeping your elbows straight, allow gravity to pull your body weight down. Your shoulders will rise toward your ears.  Raise your body against gravity by drawing your shoulder blades down your back, shortening the distance between your shoulders and ears. Although your feet should always maintain contact with the floor, your feet should progressively support less body weight, as you get stronger.  Hold for __________ seconds. In a controlled and slow manner, lower your body weight to begin the next repetition. Repeat __________ times. Complete this exercise __________ times per day.    This information is not intended to replace advice given to you by your health care provider. Make sure you discuss any questions you have with your health care provider.   Document Released: 07/21/2005 Document Revised: 08/11/2014 Document Reviewed: 11/02/2008 Elsevier Interactive Patient Education 2016 [employer]    Bacterial Vaginosis Bacterial vaginosis is an infection of the vagina. It happens when too many germs (bacteria) grow in the vagina. Having this infection puts you at risk for getting other infections from sex. Treating this infection can help lower your risk for other infections, such as:   Chlamydia.  Gonorrhea.  HIV.  Herpes. HOME CARE  Take your medicine as told by your doctor.  Finish your medicine even if you start to feel better.  Tell your sex partner that you have an infection. They should see their doctor for treatment.  During treatment:  Avoid sex or use condoms correctly.  Do not douche.  Do not drink alcohol unless your doctor tells you it is ok.  Do not breastfeed unless your doctor tells you it is ok. GET HELP IF:  You are not  getting better after 3 days of treatment.  You have more grey fluid (discharge) coming from your vagina than before.  You have more pain than before.  You have a fever. MAKE SURE YOU:   Understand these instructions.  Will watch your condition.  Will get help right away if you are not doing well or get worse.   This information is not intended to replace advice given to you by your health care provider. Make sure you discuss any questions you have with your health care provider.   Document Released: 04/29/2008 Document Revised: 08/11/2014 Document Reviewed: 03/02/2013 [employer] Interactive Patient Education 2016 [employer].   IF you received an x-ray today, you will receive an invoice from [facility]. Please contact [facility] at [phone] with questions or concerns regarding your invoice.   IF you received labwork today, you will receive an invoice from [facility] [employer]. Please contact [facility] at [phone] with questions or concerns regarding your invoice.   Our billing staff will not be able to assist you with questions regarding bills from these companies.  You will be contacted with the lab results as soon as they are available. The fastest way to get your results is to activate your My Chart account. Instructions are located on the last page of this paperwork. If you have not  heard from [place] regarding the results in 2 weeks, please contact this office.

## 2016-05-12 NOTE — Progress Notes (Addendum)
Donna Klein  MRN: [id number] DOB: February 01, 1961  Subjective:  Donna Klein is a 55 y.o. female seen in office today for a chief complaint of right shoulder pain x 1 day. States that she may have slept weird on it last night.Pain is worsened when she lies on the affected side. Denies direct trauma,  acute injury, numbness, tingling, or loss of ROM sensation. She does wear her lap tap bag on this side each day when she goes to school. She has not tried anything for pain relief.   Pt also notes she has had a new white vaginal discharge x 4 days. Has associated urinary frequency. Denies dysuria, itching, and abdominal pain. Pt is not sexually active.   Review of Systems  Constitutional: Negative for chills, diaphoresis and fever.  Cardiovascular: Negative for chest pain and palpitations.  Gastrointestinal: Negative for nausea and vomiting.  Genitourinary: Positive for difficulty urinating. Negative for flank pain, hematuria and vaginal pain.  Musculoskeletal: Negative for back pain and neck pain.    There are no active problems to display for this patient.   Current Outpatient Prescriptions on File Prior to Visit  Medication Sig Dispense Refill  . HYDROcodone-acetaminophen (NORCO/VICODIN) 5-325 MG tablet Take 1 tablet by mouth every 6 (six) hours as needed for moderate pain or severe pain. (Patient not taking: Reported on 05/12/2016) 7 tablet 0   No current facility-administered medications on file prior to visit.     No Known Allergies  Social History   Social History  . Marital status: Single    Spouse name: N/A  . Number of children: N/A  . Years of education: N/A   Occupational History  . Not on file.   Social History Main Topics  . Smoking status: Never Smoker  . Smokeless tobacco: Not on file  . Alcohol use No  . Drug use: Unknown  . Sexual activity: Not on file   Other Topics Concern  . Not on file   Social History Narrative  . No narrative on file     Objective:  BP 134/76 (BP Location: Right Arm, Patient Position: Sitting, Cuff Size: Normal)   Pulse 84   Temp 98 F (36.7 C)   Resp 16   Ht 5\' 4"  (1.626 m)   Wt 177 lb (80.3 kg)   SpO2 98%   BMI 30.38 kg/m   Physical Exam  Constitutional: She is oriented to person, place, and time and well-developed, well-nourished, and in no distress.  HENT:  Head: Normocephalic and atraumatic.  Eyes: Conjunctivae are normal.  Neck: Normal range of motion.  Pulmonary/Chest: Effort normal.  Genitourinary: Uterus normal, cervix normal, right adnexa normal, left adnexa normal and vulva normal. Thin  fishy  white and vaginal discharge found.  Musculoskeletal:       Right shoulder: She exhibits tenderness ( along palpation of anterior apect of deltoid and with intermal rotation ). She exhibits normal range of motion, no bony tenderness, no swelling and normal strength.       Left shoulder: Normal.       Right elbow: Normal.      Right wrist: Normal.  Neurological: She is alert and oriented to person, place, and time. Gait normal.  Skin: Skin is warm and dry.  Psychiatric: Affect normal.  Vitals reviewed.    Results for orders placed or performed in visit on 05/12/16 (from the past 24 hour(s))  POCT urinalysis dipstick     Status: Abnormal   Collection Time: 05/12/16  6:37  PM  Result Value Ref Range   Color, UA yellow yellow   Clarity, UA clear clear   Glucose, UA negative negative   Bilirubin, UA negative negative   Ketones, POC UA trace (5) (A) negative   Spec Grav, UA 1.025    Blood, UA negative negative   pH, UA 6.0    Protein Ur, POC trace (A) negative   Urobilinogen, UA 0.2    Nitrite, UA Negative Negative   Leukocytes, UA Negative Negative  POCT Microscopic Urinalysis ([facility])     Status: Abnormal   Collection Time: 05/12/16  6:39 PM  Result Value Ref Range   WBC,UR,HPF,POC Few (A) None WBC/hpf   RBC,UR,HPF,POC None None RBC/hpf   Bacteria Moderate (A) None, Too numerous to  count   Mucus Absent Absent   Epithelial Cells, UR Per Microscopy Moderate (A) None, Too numerous to count cells/hpf  POCT Wet + KOH Prep     Status: Abnormal   Collection Time: 05/12/16  6:51 PM  Result Value Ref Range   Yeast by KOH Absent Present, Absent   Yeast by wet prep Absent Present, Absent   WBC by wet prep Few None, Few, Too numerous to count   Clue Cells Wet Prep HPF POC Few (A) None, Too numerous to count   Trich by wet prep Absent Present, Absent   Bacteria Wet Prep HPF POC Moderate (A) None, Few, Too numerous to count   Epithelial Cells By [employer] Pref ([facility]) None None, Few, Too numerous to count   RBC,UR,HPF,POC None None RBC/hpf    Assessment and Plan :   1. Urinary frequency - POCT urinalysis dipstick - POCT Microscopic Urinalysis ([facility]) - Urine culture  2. Acute pain of right shoulder -Likely musculoskeletal, could be due to carrying lap top bag solely on right arm or from sleeping in a different position, we have talked about risks and benefits of xray, due to pain x 1 day and physical exam findings, pt agrees to trying conservative treatment before further evaluation.  -Pt will use NSAIDs daily and ice 4-5 x a day for one week, if pain persists, follow up for further evaluation, consider imaging at this time.  -Also instructed pt to switch from one arm lap tap bag to a back pack for equal distribution of weight.  - meloxicam (MOBIC) 15 MG tablet; Take 1 tablet (15 mg total) by mouth daily.  Dispense: 30 tablet; Refill: 1 - cyclobenzaprine (FLEXERIL) 5 MG tablet; Take 1 tablet (5 mg total) by mouth 3 (three) times daily as needed for muscle spasms.  Dispense: 60 tablet; Refill: 0  3. Vaginal discharge - POCT Wet + KOH Prep  4. Bacterial vaginosis -Pt instructed to not drink alcohol while on flagyl - metroNIDAZOLE (FLAGYL) 500 MG tablet; Take 1 tablet (500 mg total) by mouth 2 (two) times daily.  Dispense: 14 tablet; Refill: 0  Tenna Delaine PA-C  [facility] 05/12/2016 7:07 PM

## 2016-05-14 LAB — URINE CULTURE

## 2016-05-15 ENCOUNTER — Encounter: Payer: Self-pay | Admitting: *Deleted

## 2016-05-16 ENCOUNTER — Encounter: Payer: Self-pay | Admitting: *Deleted

## 2016-05-17 ENCOUNTER — Ambulatory Visit (INDEPENDENT_AMBULATORY_CARE_PROVIDER_SITE_OTHER): Payer: BLUE CROSS/BLUE SHIELD | Admitting: Physician Assistant

## 2016-05-17 ENCOUNTER — Ambulatory Visit (INDEPENDENT_AMBULATORY_CARE_PROVIDER_SITE_OTHER): Payer: BLUE CROSS/BLUE SHIELD

## 2016-05-17 VITALS — BP 136/68 | HR 95 | Temp 98.1°F | Resp 16 | Ht 64.25 in | Wt 180.4 lb

## 2016-05-17 DIAGNOSIS — M25511 Pain in right shoulder: Secondary | ICD-10-CM | POA: Diagnosis not present

## 2016-05-17 NOTE — Progress Notes (Signed)
Patient ID: Donna Klein, female   DOB: 1961/07/18, 55 y.o.   MRN: [id number] [facility] [address], [place] [address] [phone]  By signing my name below, I, Essence Howell, attest that this documentation has been prepared under the direction and in the presence of Ivar Drape, PA-C Electronically Signed: Ladene Artist, ED Scribe 05/17/2016 at 3:47 PM.  Date:  05/17/2016   Name:  Donna Klein   DOB:  [birth year]   MRN:  [id number]  PCP:  Henrine Screws, MD    History of Present Illness:  Donna Klein is a 55 y.o. female patient who presents to [facility] complaining of gradually worsening right shoulder pain onset 5 days ago. Pt was seen in the office on 05/12/16 for a 1 day h/o right shoulder pain without trauma. She was started on Mobic and Flexeril which she has been compliant with but states only provides temporary relief. She has also tried icing her shoulder daily without significant relief. Pt reports increased pain with lifting her shoulder and a tingling sensation that radiates into her right hand. She also states that she was carrying a back pack for school on her right shoulder prior to onset of pain but denies carrying the back pain since onset of pain 5 days ago. Pt also denies wear a bra to bed. 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 She denies SOB and difficulty breathing. No h/o of previous right shoulder pain.  There are no active problems to display for this patient.   No past medical history on file.  Past Surgical History:  Procedure Laterality Date   ABDOMINAL HYSTERECTOMY      Social History  Substance Use Topics   Smoking status: Never Smoker   Smokeless tobacco: Not on file   Alcohol use No    No family history on file.  No Known Allergies  Medication list has been reviewed and updated.  Current Outpatient Prescriptions on File Prior to Visit   Medication Sig Dispense Refill   cyclobenzaprine (FLEXERIL) 5 MG tablet Take 1 tablet (5 mg total) by mouth 3 (three) times daily as needed for muscle spasms. 60 tablet 0   HYDROcodone-acetaminophen (NORCO/VICODIN) 5-325 MG tablet Take 1 tablet by mouth every 6 (six) hours as needed for moderate pain or severe pain. 7 tablet 0   meloxicam (MOBIC) 15 MG tablet Take 1 tablet (15 mg total) by mouth daily. 30 tablet 1   metroNIDAZOLE (FLAGYL) 500 MG tablet Take 1 tablet (500 mg total) by mouth 2 (two) times daily. 14 tablet 0   No current facility-administered medications on file prior to visit.     Review of Systems  Respiratory: Negative for shortness of breath.   Musculoskeletal: Positive for joint pain.  Neurological: Positive for tingling.    Physical Examination: BP 136/68    Pulse 95    Temp 98.1 F (36.7 C) (Oral)    Resp 16    Ht 5' 4.25" (1.632 m)    Wt 180 lb 6.4 oz (81.8 kg)    SpO2 95%    BMI 30.73 kg/m  Ideal Body Weight: @FLOWAMB [id number]  Physical Exam  Constitutional: She is oriented to person, place, and time. She appears well-developed and well-nourished. No distress.  HENT:  Head: Normocephalic and atraumatic.  Right Ear: External ear normal.  Left Ear: External ear normal.  Eyes: Conjunctivae and EOM are normal. Pupils are equal, round, and  reactive to light.  Cardiovascular: Normal rate.   Pulmonary/Chest: Effort normal. No respiratory distress.  Musculoskeletal:  Cevical deviation towards the R side incites the pain. Normal ROM with external rotation and pain incited with internal rotation. Tenderness over the biceps tendon.   Neurological: She is alert and oriented to person, place, and time.  Skin: She is not diaphoretic.  Psychiatric: She has a normal mood and affect. Her behavior is normal.  Dg Shoulder Right  Result Date: 05/17/2016 CLINICAL DATA:  55 year old female with a history of right shoulder pain EXAM: RIGHT SHOULDER - 2+ VIEW  COMPARISON:  12/04/2008 FINDINGS: No acute fracture identified. Unremarkable scapular Y-view. Degenerative changes at the acromioclavicular joint. IMPRESSION: No acute bony abnormality. Signed, Dulcy Fanny. Earleen Newport, DO [facility] Electronically Signed   By: Corrie Mckusick D.O.   On: 05/17/2016 16:15    Assessment and Plan: Donna Klein is a 55 y.o. female who is here today for cc of shoulder pain. Acute pain of right shoulder - Plan: DG Shoulder Right  Ivar Drape, PA-C [facility] 10/20/201711:27 AM  I personally performed the services described in this documentation, which was scribed in my presence. The recorded information has been reviewed and is accurate.

## 2016-05-17 NOTE — Patient Instructions (Addendum)
I would like you to increase the flexeril 10mg  every 8 hours as needed.   You can continue the mobic at this time.   If your symptoms do not improve within 4 days, I would like you to return.   Shoulder Range of Motion Exercises Shoulder range of motion (ROM) exercises are designed to keep the shoulder moving freely. They are often recommended for people who have shoulder pain. MOVEMENT EXERCISE When you are able, do this exercise 5-6 days per week, or as told by your health care provider. Work toward doing 2 sets of 10 swings. Pendulum Exercise How To Do This Exercise Lying Down 1. Lie face-down on a bed with your abdomen close to the side of the bed. 2. Let your arm hang over the side of the bed. 3. Relax your shoulder, arm, and hand. 4. Slowly and gently swing your arm forward and back. Do not use your neck muscles to swing your arm. They should be relaxed. If you are struggling to swing your arm, have someone gently swing it for you. When you do this exercise for the first time, swing your arm at a 15 degree angle for 15 seconds, or swing your arm 10 times. As pain lessens over time, increase the angle of the swing to 30-45 degrees. 5. Repeat steps 1-4 with the other arm. How To Do This Exercise While Standing 1. Stand next to a sturdy chair or table and hold on to it with your hand.  Bend forward at the waist.  Bend your knees slightly.  Relax your other arm and let it hang limp.  Relax the shoulder blade of the arm that is hanging and let it drop.  While keeping your shoulder relaxed, use body motion to swing your arm in small circles. The first time you do this exercise, swing your arm for about 30 seconds or 10 times. When you do it next time, swing your arm for a little longer.  Stand up tall and relax.  Repeat steps 1-7, this time changing the direction of the circles. 2. Repeat steps 1-8 with the other arm. STRETCHING EXERCISES Do these exercises 3-4 times per day on 5-6  days per week or as told by your health care provider. Work toward holding the stretch for 20 seconds. Stretching Exercise 1 1. Lift your arm straight out in front of you. 2. Bend your arm 90 degrees at the elbow (right angle) so your forearm goes across your body and looks like the letter "L." 3. Use your other arm to gently pull the elbow forward and across your body. 4. Repeat steps 1-3 with the other arm. Stretching Exercise 2 You will need a towel or rope for this exercise. 1. Bend one arm behind your back with the palm facing outward. 2. Hold a towel with your other hand. 3. Reach the arm that holds the towel above your head, and bend that arm at the elbow. Your wrist should be behind your neck. 4. Use your free hand to grab the free end of the towel. 5. With the higher hand, gently pull the towel up behind you. 6. With the lower hand, pull the towel down behind you. 7. Repeat steps 1-6 with the other arm. STRENGTHENING EXERCISES Do each of these exercises at four different times of day (sessions) every day or as told by your health care provider. To begin with, repeat each exercise 5 times (repetitions). Work toward doing 3 sets of 12 repetitions or as told  by your health care provider. Strengthening Exercise 1 You will need a light weight for this activity. As you grow stronger, you may use a heavier weight. 1. Standing with a weight in your hand, lift your arm straight out to the side until it is at the same height as your shoulder. 2. Bend your arm at 90 degrees so that your fingers are pointing to the ceiling. 3. Slowly raise your hand until your arm is straight up in the air. 4. Repeat steps 1-3 with the other arm. Strengthening Exercise 2 You will need a light weight for this activity. As you grow stronger, you may use a heavier weight. 1. Standing with a weight in your hand, gradually move your straight arm in an arc, starting at your side, then out in front of you, then  straight up over your head. 2. Gradually move your other arm in an arc, starting at your side, then out in front of you, then straight up over your head. 3. Repeat steps 1-2 with the other arm. Strengthening Exercise 3 You will need an elastic band for this activity. As you grow stronger, gradually increase the size of the bands or increase the number of bands that you use at one time. 1. While standing, hold an elastic band in one hand and raise that arm up in the air. 2. With your other hand, pull down the band until that hand is by your side. 3. Repeat steps 1-2 with the other arm.   This information is not intended to replace advice given to you by your health care provider. Make sure you discuss any questions you have with your health care provider.   Document Released: 04/19/2003 Document Revised: 12/05/2014 Document Reviewed: 07/17/2014 Elsevier Interactive Patient Education 2016 [employer].     IF you received an x-ray today, you will receive an invoice from [facility]. Please contact [facility] at [phone] with questions or concerns regarding your invoice.   IF you received labwork today, you will receive an invoice from [employer]. Please contact [facility] at [phone] with questions or concerns regarding your invoice.   Our billing staff will not be able to assist you with questions regarding bills from these companies.  You will be contacted with the lab results as soon as they are available. The fastest way to get your results is to activate your My Chart account. Instructions are located on the last page of this paperwork. If you have not heard from [place] regarding the results in 2 weeks, please contact this office.

## 2016-11-05 ENCOUNTER — Ambulatory Visit (INDEPENDENT_AMBULATORY_CARE_PROVIDER_SITE_OTHER): Payer: BLUE CROSS/BLUE SHIELD | Admitting: Emergency Medicine

## 2016-11-05 VITALS — BP 142/92 | HR 87 | Temp 97.9°F | Ht 64.25 in | Wt 180.4 lb

## 2016-11-05 DIAGNOSIS — H1132 Conjunctival hemorrhage, left eye: Secondary | ICD-10-CM | POA: Diagnosis not present

## 2016-11-05 NOTE — Progress Notes (Signed)
Donna Klein 56 y.o.   Chief Complaint  Patient presents with  . Eye Problem    X 1 day left eye    HISTORY OF PRESENT ILLNESS: This is a 56 y.o. female complaining of left eye hemorrhage she noticed today; denies trauma.   HPI   Prior to Admission medications   Medication Sig Start Date End Date Taking? Authorizing Provider  cyclobenzaprine (FLEXERIL) 5 MG tablet Take 1 tablet (5 mg total) by mouth 3 (three) times daily as needed for muscle spasms. 05/12/16  Yes Leonie Douglas, PA-C  HYDROcodone-acetaminophen (NORCO/VICODIN) 5-325 MG tablet Take 1 tablet by mouth every 6 (six) hours as needed for moderate pain or severe pain. 07/14/15  Yes Hannah Muthersbaugh, PA-C  meloxicam (MOBIC) 15 MG tablet Take 1 tablet (15 mg total) by mouth daily. 05/12/16  Yes Leonie Douglas, PA-C    No Known Allergies  There are no active problems to display for this patient.   History reviewed. No pertinent past medical history.  Past Surgical History:  Procedure Laterality Date  . ABDOMINAL HYSTERECTOMY      Social History   Social History  . Marital status: Single    Spouse name: N/A  . Number of children: N/A  . Years of education: N/A   Occupational History  . Not on file.   Social History Main Topics  . Smoking status: Never Smoker  . Smokeless tobacco: Never Used  . Alcohol use No  . Drug use: No  . Sexual activity: Not on file   Other Topics Concern  . Not on file   Social History Narrative  . No narrative on file    Family History  Problem Relation Age of Onset  . Stroke Mother   . Diabetes Mother   . Cancer Father      Review of Systems  Constitutional: Negative.  Negative for chills and fever.  HENT: Negative for ear pain, hearing loss, nosebleeds and sore throat.   Eyes: Positive for redness (left). Negative for blurred vision, double vision, photophobia, pain and discharge.  Respiratory: Negative for cough and shortness of breath.     Cardiovascular: Negative for chest pain and palpitations.  Gastrointestinal: Negative for abdominal pain, nausea and vomiting.  Genitourinary: Negative.   Skin: Negative for rash.  Neurological: Negative for dizziness, sensory change, focal weakness and headaches.  Endo/Heme/Allergies: Does not bruise/bleed easily.  All other systems reviewed and are negative.   Vitals:   11/05/16 1756  BP: (!) 142/92  Pulse: 87  Temp: 97.9 F (36.6 C)    Physical Exam  Constitutional: She is oriented to person, place, and time. She appears well-developed and well-nourished.  HENT:  Head: Normocephalic and atraumatic.  Nose: Nose normal.  Mouth/Throat: Oropharynx is clear and moist. No oropharyngeal exudate.  Eyes: EOM are normal. Pupils are equal, round, and reactive to light. Left conjunctiva has a hemorrhage (subconjunctival).    Neck: Normal range of motion. Neck supple.  Cardiovascular: Normal rate and regular rhythm.   Pulmonary/Chest: Effort normal.  Musculoskeletal: Normal range of motion.  Lymphadenopathy:    She has no cervical adenopathy.  Neurological: She is alert and oriented to person, place, and time. No sensory deficit. She exhibits normal muscle tone. Coordination normal.  Skin: Skin is warm and dry. Capillary refill takes less than 2 seconds. No rash noted.  Psychiatric: She has a normal mood and affect.  Vitals reviewed.    ASSESSMENT & PLAN: August Albino was seen today for eye  problem.  Diagnoses and all orders for this visit:  Non-traumatic subconjunctival hemorrhage, left    Patient Instructions   We recommend that you schedule a mammogram for breast cancer screening. Typically, you do not need a referral to do this. Please contact a local imaging center to schedule your mammogram.  [facility] - [phone]  *ask for the Radiology Department The [place] ([place]) - [phone] or [phone]  [facility] - [phone] [place] [phone] [facility] - [phone]  *ask for the [facility] - [phone]  *ask for the Radiology Department [facility] - [phone]  *ask for the [place] - [phone]    IF you received an x-ray today, you will receive an invoice from [facility] Radiology. Please contact [facility] Radiology at [phone] with questions or concerns regarding your invoice.   IF you received labwork today, you will receive an invoice from [place]. Please contact [facility] at [phone] with questions or concerns regarding your invoice.   Our billing staff will not be able to assist you with questions regarding bills from these companies.  You will be contacted with the lab results as soon as they are available. The fastest way to get your results is to activate your My Chart account. Instructions are located on the last page of this paperwork. If you have not heard from [place] regarding the results in 2 weeks, please contact this office.     Subconjunctival Hemorrhage Subconjunctival hemorrhage is bleeding that happens between the white part of your eye (sclera) and the clear membrane that covers the outside of your eye (conjunctiva). There are many tiny blood vessels near the surface of your eye. A subconjunctival hemorrhage happens when one or more of these vessels breaks and bleeds, causing a red patch to appear on your eye. This is similar to a bruise. Depending on the amount of bleeding, the red patch may only cover a small area of your eye or it may cover the entire visible part of the sclera. If a lot of blood collects under the conjunctiva, there may also be swelling. Subconjunctival hemorrhages do not affect your vision or cause pain, but your eye may feel irritated if there is swelling. Subconjunctival hemorrhages usually do not require treatment, and they  disappear on their own within two weeks. What are the causes? This condition may be caused by:  Mild trauma, such as rubbing your eye too hard.  Severe trauma or blunt injuries.  Coughing, sneezing, or vomiting.  Straining, such as when lifting a heavy object.  High blood pressure.  Recent eye surgery.  A history of diabetes.  Certain medicines, especially blood thinners (anticoagulants).  Other conditions, such as eye tumors, bleeding disorders, or blood vessel abnormalities. Subconjunctival hemorrhages can happen without an obvious cause. What are the signs or symptoms? Symptoms of this condition include:  A bright red or dark red patch on the white part of the eye.  The red area may spread out to cover a larger area of the eye before it goes away.  The red area may turn brownish-yellow before it goes away.  Swelling.  Mild eye irritation. How is this diagnosed? This condition is diagnosed with a physical exam. If your subconjunctival hemorrhage was caused by trauma, your health care provider may refer you to an eye specialist (ophthalmologist) or another specialist to check for other injuries. You may have  other tests, including:  An eye exam.  A blood pressure check.  Blood tests to check for bleeding disorders. If your subconjunctival hemorrhage was caused by trauma, X-rays or a CT scan may be done to check for other injuries. How is this treated? Usually, no treatment is needed. Your health care provider may recommend eye drops or cold compresses to help with discomfort. Follow these instructions at home:  Take over-the-counter and prescription medicines only as directed by your health care provider.  Use eye drops or cold compresses to help with discomfort as directed by your health care provider.  Avoid activities, things, and environments that may irritate or injure your eye.  Keep all follow-up visits as told by your health care provider. This is  important. Contact a health care provider if:  You have pain in your eye.  The bleeding does not go away within 3 weeks.  You keep getting new subconjunctival hemorrhages. Get help right away if:  Your vision changes or you have difficulty seeing.  You suddenly develop severe sensitivity to light.  You develop a severe headache, persistent vomiting, confusion, or abnormal tiredness (lethargy).  Your eye seems to bulge or protrude from your eye socket.  You develop unexplained bruises on your body.  You have unexplained bleeding in another area of your body. This information is not intended to replace advice given to you by your health care provider. Make sure you discuss any questions you have with your health care provider. Document Released: 07/21/2005 Document Revised: 03/16/2016 Document Reviewed: 09/27/2014 Elsevier Interactive Patient Education  2017 Elsevier Inc.      Agustina Caroli, MD [facility]

## 2016-11-05 NOTE — Patient Instructions (Addendum)
We recommend that you schedule a mammogram for breast cancer screening. Typically, you do not need a referral to do this. Please contact a local imaging center to schedule your mammogram.  [facility] - [phone]  *ask for the Radiology Department The [place] ([place]) - [phone] or [phone]  [facility] - [phone] [place] [phone] [facility] - [phone]  *ask for the [facility] - [phone]  *ask for the Radiology Department [facility] - [phone]  *ask for the [place] - [phone]    IF you received an x-ray today, you will receive an invoice from [facility] Radiology. Please contact [facility] Radiology at [phone] with questions or concerns regarding your invoice.   IF you received labwork today, you will receive an invoice from [place]. Please contact [facility] at [phone] with questions or concerns regarding your invoice.   Our billing staff will not be able to assist you with questions regarding bills from these companies.  You will be contacted with the lab results as soon as they are available. The fastest way to get your results is to activate your My Chart account. Instructions are located on the last page of this paperwork. If you have not heard from [place] regarding the results in 2 weeks, please contact this office.     Subconjunctival Hemorrhage Subconjunctival hemorrhage is bleeding that happens between the white part of your eye (sclera) and the clear membrane that covers the outside of your eye (conjunctiva). There are many tiny blood vessels near the surface of your eye. A subconjunctival hemorrhage happens when one or more of these vessels breaks and bleeds, causing a red patch to appear on your eye. This is similar to a bruise. Depending on the amount of bleeding, the red patch  may only cover a small area of your eye or it may cover the entire visible part of the sclera. If a lot of blood collects under the conjunctiva, there may also be swelling. Subconjunctival hemorrhages do not affect your vision or cause pain, but your eye may feel irritated if there is swelling. Subconjunctival hemorrhages usually do not require treatment, and they disappear on their own within two weeks. What are the causes? This condition may be caused by:  Mild trauma, such as rubbing your eye too hard.  Severe trauma or blunt injuries.  Coughing, sneezing, or vomiting.  Straining, such as when lifting a heavy object.  High blood pressure.  Recent eye surgery.  A history of diabetes.  Certain medicines, especially blood thinners (anticoagulants).  Other conditions, such as eye tumors, bleeding disorders, or blood vessel abnormalities. Subconjunctival hemorrhages can happen without an obvious cause. What are the signs or symptoms? Symptoms of this condition include:  A bright red or dark red patch on the white part of the eye.  The red area may spread out to cover a larger area of the eye before it goes away.  The red area may turn brownish-yellow before it goes away.  Swelling.  Mild eye irritation. How is this diagnosed? This condition is diagnosed with a physical exam. If your subconjunctival hemorrhage was caused by trauma, your health care provider may refer you to an eye specialist (ophthalmologist) or another specialist to check for other injuries. You may have other tests, including:  An eye exam.  A blood pressure check.  Blood tests to check for bleeding disorders. If your  subconjunctival hemorrhage was caused by trauma, X-rays or a CT scan may be done to check for other injuries. How is this treated? Usually, no treatment is needed. Your health care provider may recommend eye drops or cold compresses to help with discomfort. Follow these instructions at  home:  Take over-the-counter and prescription medicines only as directed by your health care provider.  Use eye drops or cold compresses to help with discomfort as directed by your health care provider.  Avoid activities, things, and environments that may irritate or injure your eye.  Keep all follow-up visits as told by your health care provider. This is important. Contact a health care provider if:  You have pain in your eye.  The bleeding does not go away within 3 weeks.  You keep getting new subconjunctival hemorrhages. Get help right away if:  Your vision changes or you have difficulty seeing.  You suddenly develop severe sensitivity to light.  You develop a severe headache, persistent vomiting, confusion, or abnormal tiredness (lethargy).  Your eye seems to bulge or protrude from your eye socket.  You develop unexplained bruises on your body.  You have unexplained bleeding in another area of your body. This information is not intended to replace advice given to you by your health care provider. Make sure you discuss any questions you have with your health care provider. Document Released: 07/21/2005 Document Revised: 03/16/2016 Document Reviewed: 09/27/2014 Elsevier Interactive Patient Education  2017 [employer].

## 2018-09-18 ENCOUNTER — Ambulatory Visit: Payer: Self-pay | Admitting: Family Medicine

## 2018-09-18 ENCOUNTER — Other Ambulatory Visit: Payer: Self-pay

## 2018-09-18 ENCOUNTER — Encounter: Payer: Self-pay | Admitting: Family Medicine

## 2018-09-18 VITALS — BP 119/84 | HR 60 | Temp 98.0°F | Ht 66.0 in | Wt 175.8 lb

## 2018-09-18 DIAGNOSIS — R05 Cough: Secondary | ICD-10-CM

## 2018-09-18 DIAGNOSIS — R059 Cough, unspecified: Secondary | ICD-10-CM

## 2018-09-18 DIAGNOSIS — J019 Acute sinusitis, unspecified: Secondary | ICD-10-CM

## 2018-09-18 MED ORDER — AMOXICILLIN-POT CLAVULANATE 875-125 MG PO TABS: 1.0000 | ORAL_TABLET | Freq: Two times a day (BID) | ORAL | 0 refills | Status: AC

## 2018-09-18 MED ORDER — FLUTICASONE PROPIONATE 50 MCG/ACT NA SUSP: NASAL | 0 refills | Status: AC

## 2018-09-18 NOTE — Patient Instructions (Addendum)
Drink plenty of fluids and get enough rest  Fluticasone nose spray 2 sprays each nostril twice daily for 3 days, then once daily  Augmentin 875 1 twice daily for infection  Return if worse or not improving  In the next week or so when this is come down I highly recommend you get an influenza vaccine. It is not too late this season.   Sinusitis, Adult Sinusitis is inflammation of your sinuses. Sinuses are hollow spaces in the bones around your face. Your sinuses are located:  Around your eyes.  In the middle of your forehead.  Behind your nose.  In your cheekbones. Mucus normally drains out of your sinuses. When your nasal tissues become inflamed or swollen, mucus can become trapped or blocked. This allows bacteria, viruses, and fungi to grow, which leads to infection. Most infections of the sinuses are caused by a virus. Sinusitis can develop quickly. It can last for up to 4 weeks (acute) or for more than 12 weeks (chronic). Sinusitis often develops after a cold. What are the causes? This condition is caused by anything that creates swelling in the sinuses or stops mucus from draining. This includes:  Allergies.  Asthma.  Infection from bacteria or viruses.  Deformities or blockages in your nose or sinuses.  Abnormal growths in the nose (nasal polyps).  Pollutants, such as chemicals or irritants in the air.  Infection from fungi (rare). What increases the risk? You are more likely to develop this condition if you:  Have a weak body defense system (immune system).  Do a lot of swimming or diving.  Overuse nasal sprays.  Smoke. What are the signs or symptoms? The main symptoms of this condition are pain and a feeling of pressure around the affected sinuses. Other symptoms include:  Stuffy nose or congestion.  Thick drainage from your nose.  Swelling and warmth over the affected sinuses.  Headache.  Upper toothache.  A cough that may get worse at  night.  Extra mucus that collects in the throat or the back of the nose (postnasal drip).  Decreased sense of smell and taste.  Fatigue.  A fever.  Sore throat.  Bad breath. How is this diagnosed? This condition is diagnosed based on:  Your symptoms.  Your medical history.  A physical exam.  Tests to find out if your condition is acute or chronic. This may include: ? Checking your nose for nasal polyps. ? Viewing your sinuses using a device that has a light (endoscope). ? Testing for allergies or bacteria. ? Imaging tests, such as an MRI or CT scan. In rare cases, a bone biopsy may be done to rule out more serious types of fungal sinus disease. How is this treated? Treatment for sinusitis depends on the cause and whether your condition is chronic or acute.  If caused by a virus, your symptoms should go away on their own within 10 days. You may be given medicines to relieve symptoms. They include: ? Medicines that shrink swollen nasal passages (topical intranasal decongestants). ? Medicines that treat allergies (antihistamines). ? A spray that eases inflammation of the nostrils (topical intranasal corticosteroids). ? Rinses that help get rid of thick mucus in your nose (nasal saline washes).  If caused by bacteria, your health care provider may recommend waiting to see if your symptoms improve. Most bacterial infections will get better without antibiotic medicine. You may be given antibiotics if you have: ? A severe infection. ? A weak immune system.  If  caused by narrow nasal passages or nasal polyps, you may need to have surgery. Follow these instructions at home: Medicines  Take, use, or apply over-the-counter and prescription medicines only as told by your health care provider. These may include nasal sprays.  If you were prescribed an antibiotic medicine, take it as told by your health care provider. Do not stop taking the antibiotic even if you start to feel  better. Hydrate and humidify   Drink enough fluid to keep your urine pale yellow. Staying hydrated will help to thin your mucus.  Use a cool mist humidifier to keep the humidity level in your home above 50%.  Inhale steam for 10-15 minutes, 3-4 times a day, or as told by your health care provider. You can do this in the bathroom while a hot shower is running.  Limit your exposure to cool or dry air. Rest  Rest as much as possible.  Sleep with your head raised (elevated).  Make sure you get enough sleep each night. General instructions   Apply a warm, moist washcloth to your face 3-4 times a day or as told by your health care provider. This will help with discomfort.  Wash your hands often with soap and water to reduce your exposure to germs. If soap and water are not available, use hand sanitizer.  Do not smoke. Avoid being around people who are smoking (secondhand smoke).  Keep all follow-up visits as told by your health care provider. This is important. Contact a health care provider if:  You have a fever.  Your symptoms get worse.  Your symptoms do not improve within 10 days. Get help right away if:  You have a severe headache.  You have persistent vomiting.  You have severe pain or swelling around your face or eyes.  You have vision problems.  You develop confusion.  Your neck is stiff.  You have trouble breathing. Summary  Sinusitis is soreness and inflammation of your sinuses. Sinuses are hollow spaces in the bones around your face.  This condition is caused by nasal tissues that become inflamed or swollen. The swelling traps or blocks the flow of mucus. This allows bacteria, viruses, and fungi to grow, which leads to infection.  If you were prescribed an antibiotic medicine, take it as told by your health care provider. Do not stop taking the antibiotic even if you start to feel better.  Keep all follow-up visits as told by your health care provider.  This is important. This information is not intended to replace advice given to you by your health care provider. Make sure you discuss any questions you have with your health care provider. Document Released: 07/21/2005 Document Revised: 12/21/2017 Document Reviewed: 12/21/2017 Elsevier Interactive Patient Education  [employer].   If you have lab work done today you will be contacted with your lab results within the next 2 weeks.  If you have not heard from [place] then please contact us. The fastest way to get your results is to register for My Chart.   IF you received an x-ray today, you will receive an invoice from [facility] Radiology. Please contact [facility] Radiology at [phone] with questions or concerns regarding your invoice.   IF you received labwork today, you will receive an invoice from [place]. Please contact [facility] at [phone] with questions or concerns regarding your invoice.   Our billing staff will not be able to assist you with questions regarding bills from these companies.  You will be contacted  with the lab results as soon as they are available. The fastest way to get your results is to activate your My Chart account. Instructions are located on the last page of this paperwork. If you have not heard from [place] regarding the results in 2 weeks, please contact this office.

## 2018-09-18 NOTE — Progress Notes (Signed)
Patient ID: Donna Klein, female    DOB: August 08, 1960  Age: 58 y.o. MRN: [id number]  Chief Complaint  Patient presents with  . Sinusitis    lots of yellow mucus    Subjective:   58 year old lady who comes in with a history of a upper respiratory infection for the last 9 days.  Now it seems to have settled in her sinuses.  She has yellow drainage.  She has a cough from it.  She does not smoke.  She lives alone, works as a Radiation protection practitioner for [employer].  Really she is a healthy person.  She does not have a history of taking any flu shots.  Current allergies, medications, problem list, past/family and social histories reviewed.  Objective:  BP 119/84 (BP Location: Right Arm, Patient Position: Sitting, Cuff Size: Normal)   Pulse 60   Temp 98 F (36.7 C) (Oral)   Ht 5\' 6"  (1.676 m)   Wt 175 lb 12.8 oz (79.7 kg)   SpO2 95%   BMI 28.37 kg/m   No major distress.  Obvious head congestion.  TMs normal.  Nose congested.  Maxillary and frontal areas not tender to percussion.  Throat is clear but has a little postnasal drainage.  Neck supple without significant nodes.  Chest is clear to auscultation even on forced expiration.  Heart rate without murmur.  Assessment & Plan:   Assessment: 1. Acute non-recurrent sinusitis, unspecified location   2. Cough       Plan: See instructions  No orders of the defined types were placed in this encounter.   Meds ordered this encounter  Medications  . fluticasone (FLONASE) 50 MCG/ACT nasal spray    Sig: Use 2 sprays each nostril twice daily for 3 days, then once daily    Dispense:  16 g    Refill:  0  . amoxicillin-clavulanate (AUGMENTIN) 875-125 MG tablet    Sig: Take 1 tablet by mouth 2 (two) times daily.    Dispense:  20 tablet    Refill:  0         Patient Instructions     Drink plenty of fluids and get enough rest  Fluticasone nose spray 2 sprays each nostril twice daily for 3 days, then once daily  Augmentin 875 1  twice daily for infection  Return if worse or not improving  In the next week or so when this is come down I highly recommend you get an influenza vaccine. It is not too late this season.   Sinusitis, Adult Sinusitis is inflammation of your sinuses. Sinuses are hollow spaces in the bones around your face. Your sinuses are located:  Around your eyes.  In the middle of your forehead.  Behind your nose.  In your cheekbones. Mucus normally drains out of your sinuses. When your nasal tissues become inflamed or swollen, mucus can become trapped or blocked. This allows bacteria, viruses, and fungi to grow, which leads to infection. Most infections of the sinuses are caused by a virus. Sinusitis can develop quickly. It can last for up to 4 weeks (acute) or for more than 12 weeks (chronic). Sinusitis often develops after a cold. What are the causes? This condition is caused by anything that creates swelling in the sinuses or stops mucus from draining. This includes:  Allergies.  Asthma.  Infection from bacteria or viruses.  Deformities or blockages in your nose or sinuses.  Abnormal growths in the nose (nasal polyps).  Pollutants, such as chemicals or  irritants in the air.  Infection from fungi (rare). What increases the risk? You are more likely to develop this condition if you:  Have a weak body defense system (immune system).  Do a lot of swimming or diving.  Overuse nasal sprays.  Smoke. What are the signs or symptoms? The main symptoms of this condition are pain and a feeling of pressure around the affected sinuses. Other symptoms include:  Stuffy nose or congestion.  Thick drainage from your nose.  Swelling and warmth over the affected sinuses.  Headache.  Upper toothache.  A cough that may get worse at night.  Extra mucus that collects in the throat or the back of the nose (postnasal drip).  Decreased sense of smell and taste.  Fatigue.  A  fever.  Sore throat.  Bad breath. How is this diagnosed? This condition is diagnosed based on:  Your symptoms.  Your medical history.  A physical exam.  Tests to find out if your condition is acute or chronic. This may include: ? Checking your nose for nasal polyps. ? Viewing your sinuses using a device that has a light (endoscope). ? Testing for allergies or bacteria. ? Imaging tests, such as an MRI or CT scan. In rare cases, a bone biopsy may be done to rule out more serious types of fungal sinus disease. How is this treated? Treatment for sinusitis depends on the cause and whether your condition is chronic or acute.  If caused by a virus, your symptoms should go away on their own within 10 days. You may be given medicines to relieve symptoms. They include: ? Medicines that shrink swollen nasal passages (topical intranasal decongestants). ? Medicines that treat allergies (antihistamines). ? A spray that eases inflammation of the nostrils (topical intranasal corticosteroids). ? Rinses that help get rid of thick mucus in your nose (nasal saline washes).  If caused by bacteria, your health care provider may recommend waiting to see if your symptoms improve. Most bacterial infections will get better without antibiotic medicine. You may be given antibiotics if you have: ? A severe infection. ? A weak immune system.  If caused by narrow nasal passages or nasal polyps, you may need to have surgery. Follow these instructions at home: Medicines  Take, use, or apply over-the-counter and prescription medicines only as told by your health care provider. These may include nasal sprays.  If you were prescribed an antibiotic medicine, take it as told by your health care provider. Do not stop taking the antibiotic even if you start to feel better. Hydrate and humidify   Drink enough fluid to keep your urine pale yellow. Staying hydrated will help to thin your mucus.  Use a cool mist  humidifier to keep the humidity level in your home above 50%.  Inhale steam for 10-15 minutes, 3-4 times a day, or as told by your health care provider. You can do this in the bathroom while a hot shower is running.  Limit your exposure to cool or dry air. Rest  Rest as much as possible.  Sleep with your head raised (elevated).  Make sure you get enough sleep each night. General instructions   Apply a warm, moist washcloth to your face 3-4 times a day or as told by your health care provider. This will help with discomfort.  Wash your hands often with soap and water to reduce your exposure to germs. If soap and water are not available, use hand sanitizer.  Do not smoke. Avoid being around  people who are smoking (secondhand smoke).  Keep all follow-up visits as told by your health care provider. This is important. Contact a health care provider if:  You have a fever.  Your symptoms get worse.  Your symptoms do not improve within 10 days. Get help right away if:  You have a severe headache.  You have persistent vomiting.  You have severe pain or swelling around your face or eyes.  You have vision problems.  You develop confusion.  Your neck is stiff.  You have trouble breathing. Summary  Sinusitis is soreness and inflammation of your sinuses. Sinuses are hollow spaces in the bones around your face.  This condition is caused by nasal tissues that become inflamed or swollen. The swelling traps or blocks the flow of mucus. This allows bacteria, viruses, and fungi to grow, which leads to infection.  If you were prescribed an antibiotic medicine, take it as told by your health care provider. Do not stop taking the antibiotic even if you start to feel better.  Keep all follow-up visits as told by your health care provider. This is important. This information is not intended to replace advice given to you by your health care provider. Make sure you discuss any questions you  have with your health care provider. Document Released: 07/21/2005 Document Revised: 12/21/2017 Document Reviewed: 12/21/2017 Elsevier Interactive Patient Education  [employer].   If you have lab work done today you will be contacted with your lab results within the next 2 weeks.  If you have not heard from [place] then please contact us. The fastest way to get your results is to register for My Chart.   IF you received an x-ray today, you will receive an invoice from [facility]. Please contact [facility] at [phone] with questions or concerns regarding your invoice.   IF you received labwork today, you will receive an invoice from [facility]. Please contact [facility] at [phone] with questions or concerns regarding your invoice.   Our billing staff will not be able to assist you with questions regarding bills from these companies.  You will be contacted with the lab results as soon as they are available. The fastest way to get your results is to activate your My Chart account. Instructions are located on the last page of this paperwork. If you have not heard from [place] regarding the results in 2 weeks, please contact this office.         Return if symptoms worsen or fail to improve.   Ruben Reason, MD 09/18/2018

## 2019-05-12 ENCOUNTER — Emergency Department (HOSPITAL_BASED_OUTPATIENT_CLINIC_OR_DEPARTMENT_OTHER)
Admission: EM | Admit: 2019-05-12 | Discharge: 2019-05-12 | Disposition: A | Discharge status: Home / Self Care | Payer: Managed Care, Other (non HMO) | Attending: Emergency Medicine | Admitting: Emergency Medicine

## 2019-05-12 ENCOUNTER — Other Ambulatory Visit: Payer: Self-pay

## 2019-05-12 ENCOUNTER — Encounter (HOSPITAL_BASED_OUTPATIENT_CLINIC_OR_DEPARTMENT_OTHER): Payer: Self-pay

## 2019-05-12 DIAGNOSIS — Z79899 Other long term (current) drug therapy: Secondary | ICD-10-CM | POA: Insufficient documentation

## 2019-05-12 DIAGNOSIS — M79601 Pain in right arm: Secondary | ICD-10-CM | POA: Insufficient documentation

## 2019-05-12 MED ORDER — METHOCARBAMOL 500 MG PO TABS: 1000.0000 mg | ORAL_TABLET | Freq: Three times a day (TID) | ORAL | 0 refills | Status: AC | PRN

## 2019-05-12 MED FILL — METHOCARBAMOL 500 MG TABLET: 500 | 6 days supply | Qty: 20 | Fill #0

## 2019-05-12 NOTE — ED Triage Notes (Signed)
Pt c/o pain to right UE x 2 weeks-after carrying heavy purse-also c/o pain to posterior neck x 1 week-denies injury to either site-pain worse with movement-NAD-steady gait

## 2019-05-12 NOTE — ED Provider Notes (Signed)
[facility] EMERGENCY DEPARTMENT Provider Note   CSN: [id number] Arrival date & time: 05/12/19  1606     History   Chief Complaint Chief Complaint  Patient presents with  . Arm Pain    HPI Donna Klein is a 58 y.o. female.  She is complaining of some right arm pain that been giving her troubles for 2 or 3 weeks.  She attributes it to carrying her purse on her forearm although there was no fall or particular injury.  She said it is also causing some strain in her neck and shoulders.  She is been using Aleve without any improvement.  Says she feels a throbbing at night is keeping her up.  No prior history of same.  No numbness or weakness.  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     The history is provided by the patient.  Arm Pain This is a new problem. The current episode started more than 1 week ago. The problem occurs constantly. The problem has not changed since onset.Pertinent negatives include no chest pain, no abdominal pain, no headaches and no shortness of breath. The symptoms are aggravated by twisting and bending. Nothing relieves the symptoms. She has tried acetaminophen for the symptoms. The treatment provided no relief.    History reviewed. No pertinent past medical history.  Patient Active Problem List   Diagnosis Date Noted  . Non-traumatic subconjunctival hemorrhage, left 11/05/2016    Past Surgical History:  Procedure Laterality Date  . ABDOMINAL HYSTERECTOMY       OB History   No obstetric history on file.      Home Medications    Prior to Admission medications   Medication Sig Start Date End Date Taking? Authorizing Provider  amoxicillin-clavulanate (AUGMENTIN) 875-125 MG tablet Take 1 tablet by mouth 2 (two) times daily. 09/18/18   Posey Boyer, MD  fluticasone Asencion Islam) 50 MCG/ACT nasal spray Use 2 sprays each nostril twice daily for 3 days, then once daily 09/18/18   Posey Boyer, MD    Family History  Family History  Problem Relation Age of Onset  . Stroke Mother   . Diabetes Mother   . Cancer Father     Social History Social History   Tobacco Use  . Smoking status: Never Smoker  . Smokeless tobacco: Never Used  Substance Use Topics  . Alcohol use: No  . Drug use: No     Allergies   Patient has no known allergies.   Review of Systems Review of Systems  Constitutional: Negative for fever.  Respiratory: Negative for shortness of breath.   Cardiovascular: Negative for chest pain.  Gastrointestinal: Negative for abdominal pain.  Musculoskeletal: Positive for neck pain. Negative for back pain.  Skin: Negative for wound.  Neurological: Negative for weakness, numbness and headaches.     Physical Exam Updated Vital Signs BP (!) 148/95 (BP Location: Left Arm)   Pulse 80   Temp 98.5 F (36.9 C) (Oral)   Resp 18   SpO2 98%   Physical Exam Constitutional:      Appearance: She is well-developed.  HENT:     Head: Normocephalic and atraumatic.  Eyes:     Conjunctiva/sclera: Conjunctivae normal.  Neck:     Musculoskeletal: Neck supple.  Musculoskeletal: Normal range of motion.        General: Tenderness present. No swelling, deformity or signs of injury.     Right lower leg: No edema.  Left lower leg: No edema.     Comments: She has full range of motion of her right shoulder right elbow right wrist.  There is a little bit of tenderness through her mid forearm.  No pain with supination pronation.  Distal radial pulse 2+ and cap refill brisk radian ulnar median sensation intact.  Skin:    General: Skin is warm and dry.     Capillary Refill: Capillary refill takes less than 2 seconds.  Neurological:     General: No focal deficit present.     Mental Status: She is alert.     GCS: GCS eye subscore is 4. GCS verbal subscore is 5. GCS motor subscore is 6.     Sensory: No sensory deficit.     Motor: No weakness.      ED Treatments / Results  Labs (all labs  ordered are listed, but only abnormal results are displayed) Labs Reviewed - No data to display  EKG None  Radiology No results found.  Procedures Procedures (including critical care time)  Medications Ordered in ED Medications - No data to display   Initial Impression / Assessment and Plan / ED Course  I have reviewed the triage vital signs and the nursing notes.  Pertinent labs & imaging results that were available during my care of the patient were reviewed by me and considered in my medical decision making (see chart for details).  Clinical Course as of May 11 1705  Thu May 11, 4072  387 58 year old female with right forearm pain times a few weeks now also affecting her shoulder and neck.  She has benign exam.  Intact neurologic.  Likely related to some sort of muscle strain or overuse.  She is taking NSAIDs already will add muscle relaxant refer him to sports medicine for possible therapy.   [MB]    Clinical Course User Index [MB] Hayden Rasmussen, MD        Final Clinical Impressions(s) / ED Diagnoses   Final diagnoses:  Right arm pain    ED Discharge Orders    None       Hayden Rasmussen, MD 05/12/19 [phone]

## 2019-05-12 NOTE — Discharge Instructions (Addendum)
You were seen in the emergency department for right forearm pain times a few weeks that is now also affecting your neck and shoulders.  This is likely muscular and you should continue to use anti-inflammatories.  We are also prescribing a muscle relaxant which may help your symptoms but possibly could cause sedation so please be careful using.  Please gently stretch the area and you may benefit from following up with sports medicine, Dr. Raeford Razor number given.

## 2021-04-09 ENCOUNTER — Other Ambulatory Visit: Payer: Self-pay | Admitting: Obstetrics and Gynecology

## 2021-04-09 DIAGNOSIS — R928 Other abnormal and inconclusive findings on diagnostic imaging of breast: Secondary | ICD-10-CM

## 2021-04-22 ENCOUNTER — Other Ambulatory Visit: Payer: Self-pay | Admitting: Obstetrics and Gynecology

## 2021-04-22 ENCOUNTER — Ambulatory Visit
Admission: RE | Admit: 2021-04-22 | Discharge: 2021-04-22 | Disposition: A | Discharge status: Home / Self Care | Payer: BC Managed Care – PPO | Source: Ambulatory Visit | Attending: Obstetrics and Gynecology | Admitting: Obstetrics and Gynecology

## 2021-04-22 ENCOUNTER — Other Ambulatory Visit: Payer: Self-pay

## 2021-04-22 DIAGNOSIS — R928 Other abnormal and inconclusive findings on diagnostic imaging of breast: Secondary | ICD-10-CM

## 2021-06-11 ENCOUNTER — Encounter: Payer: Self-pay | Admitting: Skilled Nursing Facility1

## 2021-06-11 ENCOUNTER — Other Ambulatory Visit: Payer: Self-pay

## 2021-06-11 ENCOUNTER — Encounter: Payer: BC Managed Care – PPO | Attending: Internal Medicine | Admitting: Skilled Nursing Facility1

## 2021-06-11 DIAGNOSIS — E119 Type 2 diabetes mellitus without complications: Secondary | ICD-10-CM | POA: Diagnosis not present

## 2021-06-11 NOTE — Progress Notes (Signed)
Diabetes Self-Management Education  Visit Type: First/Initial   06/11/2021  Donna Klein, identified by name and date of birth, is a 60 y.o. female with a diagnosis of Diabetes: Type 2.   ASSESSMENT  There were no vitals taken for this visit. There is no height or weight on file to calculate BMI.  DM Rx: Metformin   Pt states she cut back on fried food because it was causing wieght gain  Pt states she checks her bllod sugars 4 times a day with the oihighestr being 120.  Fasting: 100-101; before meals and before day Blood pressure checking daily stating it is normal.  Pt states she tries not to eat past 7:30pm.   Pt is already well informed and has a balanced diet.    Diabetes Self-Management Education - 06/11/21 0844       Visit Information   Visit Type First/Initial      Initial Visit   Diabetes Type Type 2    Are you currently following a meal plan? No    Are you taking your medications as prescribed? Yes      Health Coping   How would you rate your overall health? Good      Psychosocial Assessment   Patient Belief/Attitude about Diabetes Motivated to manage diabetes    Self-care barriers None    Self-management support Family;Friends    Special Needs None    Learning Readiness Change in progress    How often do you need to have someone help you when you read instructions, pamphlets, or other written materials from your doctor or pharmacy? 1 - Never      Pre-Education Assessment   Patient understands the diabetes disease and treatment process. Needs Instruction    Patient understands incorporating nutritional management into lifestyle. Needs Instruction    Patient undertands incorporating physical activity into lifestyle. Needs Instruction    Patient understands using medications safely. Needs Instruction    Patient understands monitoring blood glucose, interpreting and using results Needs Instruction    Patient understands prevention, detection, and  treatment of acute complications. Needs Instruction    Patient understands prevention, detection, and treatment of chronic complications. Needs Instruction    Patient understands how to develop strategies to address psychosocial issues. Needs Instruction    Patient understands how to develop strategies to promote health/change behavior. Needs Instruction      Complications   Last HgB A1C per patient/outside source 6.8 %    How often do you check your blood sugar? 3-4 times/day    Fasting Blood glucose range (mg/dL) 70-129    Postprandial Blood glucose range (mg/dL) 70-129    Number of hypoglycemic episodes per month 0    Number of hyperglycemic episodes per week 0    Have you had a dilated eye exam in the past 12 months? Yes    Have you had a dental exam in the past 12 months? Yes    Are you checking your feet? Yes    How many days per week are you checking your feet? 7      Dietary Intake   Breakfast oatmeal +    Snack (morning) grapes or blueberries and cashews    Lunch chicken + sweet potato or cabbage or greens    Snack (afternoon) cashews or popcorn    Dinner chicken or shrimp or chicken salad or meatloaf + greens or cabbage    Snack (evening) peanut butter    Beverage(s) 4 bottles of water, lemonade,  cranberry juice      Exercise   Exercise Type Moderate (swimming / aerobic walking)    How many days per week to you exercise? 5    How many minutes per day do you exercise? 60    Total minutes per week of exercise 300      Patient Education   Previous Diabetes Education No    Disease state  Factors that contribute to the development of diabetes    Acute complications Taught treatment of hypoglycemia - the 15 rule.;Discussed and identified patients' treatment of hyperglycemia.      Individualized Goals (developed by patient)   Nutrition General guidelines for healthy choices and portions discussed    Physical Activity Exercise 5-7 days per week;60 minutes per day     Monitoring  test my blood glucose as discussed      Post-Education Assessment   Patient understands the diabetes disease and treatment process. Demonstrates understanding / competency    Patient understands incorporating nutritional management into lifestyle. Demonstrates understanding / competency    Patient undertands incorporating physical activity into lifestyle. Demonstrates understanding / competency    Patient understands using medications safely. Demonstrates understanding / competency    Patient understands monitoring blood glucose, interpreting and using results Demonstrates understanding / competency    Patient understands prevention, detection, and treatment of acute complications. Demonstrates understanding / competency    Patient understands prevention, detection, and treatment of chronic complications. Demonstrates understanding / competency    Patient understands how to develop strategies to promote health/change behavior. Demonstrates understanding / competency      Outcomes   Expected Outcomes Demonstrated interest in learning. Expect positive outcomes    Future DMSE PRN    Program Status Completed             Individualized Plan for Diabetes Self-Management Training:   Learning Objective:  Patient will have a greater understanding of diabetes self-management. Patient education plan is to attend individual and/or group sessions per assessed needs and concerns.     Expected Outcomes:  Demonstrated interest in learning. Expect positive outcomes  Education material provided: ADA - How to Thrive: A Guide for Your Journey with Diabetes and Meal plan card  If problems or questions, patient to contact team via:  Email  Future DSME appointment: PRN

## 2021-10-21 ENCOUNTER — Ambulatory Visit
Admission: RE | Admit: 2021-10-21 | Discharge: 2021-10-21 | Disposition: A | Discharge status: Home / Self Care | Payer: BC Managed Care – PPO | Source: Ambulatory Visit | Attending: Obstetrics and Gynecology | Admitting: Obstetrics and Gynecology

## 2021-10-21 ENCOUNTER — Other Ambulatory Visit: Payer: Self-pay | Admitting: Obstetrics and Gynecology

## 2021-10-21 DIAGNOSIS — R921 Mammographic calcification found on diagnostic imaging of breast: Secondary | ICD-10-CM

## 2021-10-21 DIAGNOSIS — R928 Other abnormal and inconclusive findings on diagnostic imaging of breast: Secondary | ICD-10-CM

## 2021-11-01 ENCOUNTER — Ambulatory Visit
Admission: RE | Admit: 2021-11-01 | Discharge: 2021-11-01 | Disposition: A | Discharge status: Home / Self Care | Payer: BC Managed Care – PPO | Source: Ambulatory Visit | Attending: Obstetrics and Gynecology | Admitting: Obstetrics and Gynecology

## 2021-11-01 DIAGNOSIS — R921 Mammographic calcification found on diagnostic imaging of breast: Secondary | ICD-10-CM

## 2021-11-05 ENCOUNTER — Other Ambulatory Visit: Payer: Self-pay | Admitting: Obstetrics and Gynecology

## 2021-11-05 DIAGNOSIS — C50911 Malignant neoplasm of unspecified site of right female breast: Secondary | ICD-10-CM

## 2021-11-08 ENCOUNTER — Ambulatory Visit
Admission: RE | Admit: 2021-11-08 | Discharge: 2021-11-08 | Disposition: A | Discharge status: Home / Self Care | Payer: BC Managed Care – PPO | Source: Ambulatory Visit | Attending: Obstetrics and Gynecology | Admitting: Obstetrics and Gynecology

## 2021-11-08 DIAGNOSIS — C50911 Malignant neoplasm of unspecified site of right female breast: Secondary | ICD-10-CM

## 2021-11-12 ENCOUNTER — Ambulatory Visit
Admission: RE | Admit: 2021-11-12 | Discharge: 2021-11-12 | Disposition: A | Discharge status: Home / Self Care | Payer: BC Managed Care – PPO | Source: Ambulatory Visit | Attending: Obstetrics and Gynecology | Admitting: Obstetrics and Gynecology

## 2021-11-12 MED ORDER — GADOBUTROL 1 MMOL/ML IV SOLN
8.0000 mL | Freq: Once | INTRAVENOUS | Status: AC | PRN
  Administered 2021-11-12: 8 mL via INTRAVENOUS

## 2023-05-30 IMAGING — MG DIGITAL DIAGNOSTIC BILAT W/ CAD
8 of 10 series · 8 of 10 positions shown · non-contrast
Comparison: Previous exam(s).

CLINICAL DATA: Screening recall for bilateral breast
calcifications.

EXAM:
DIGITAL DIAGNOSTIC BILATERAL MAMMOGRAM WITH CAD
TECHNIQUE: Bilateral digital diagnostic mammography was performed. Mammographic
images were processed with CAD.

[L ML (1 of 3)]
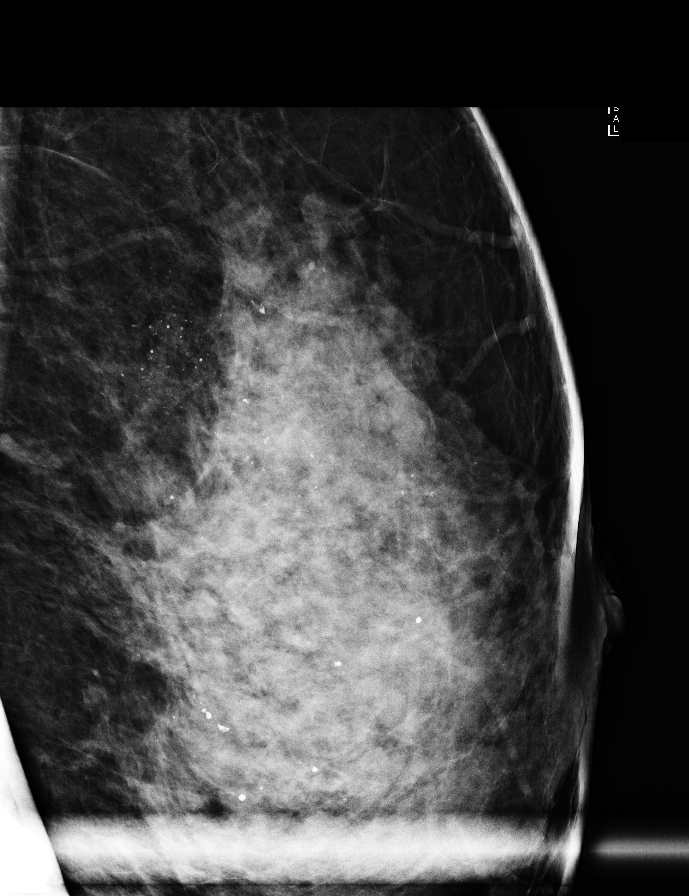

[R ML]
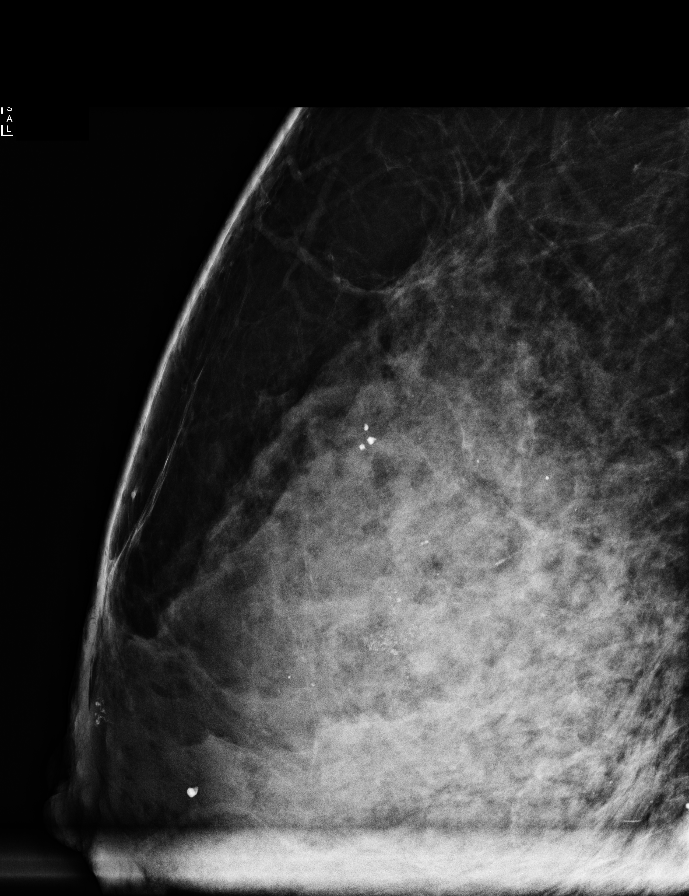

[L CC (1 of 2)]
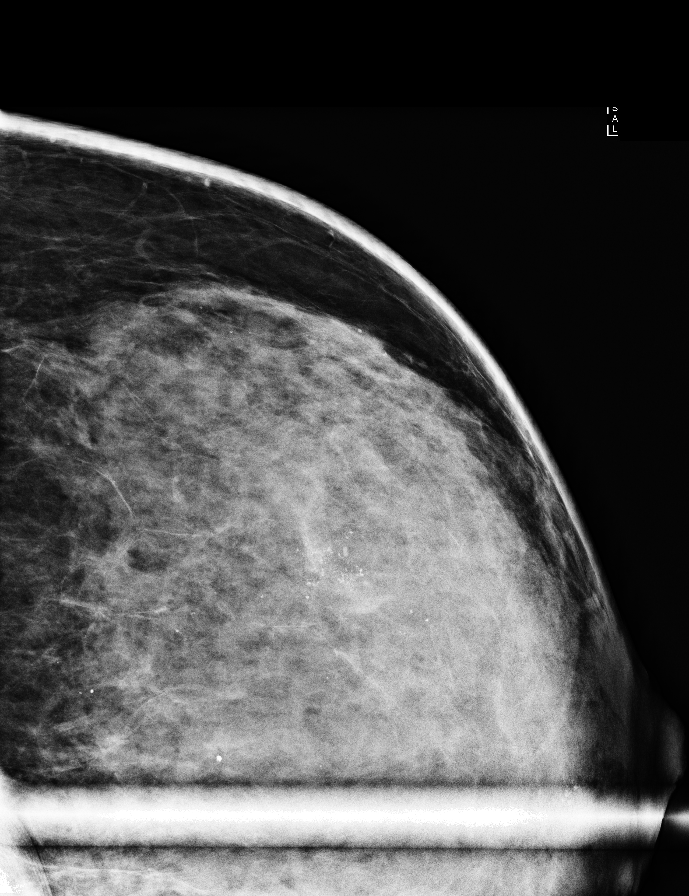

[R CC (1 of 2)]
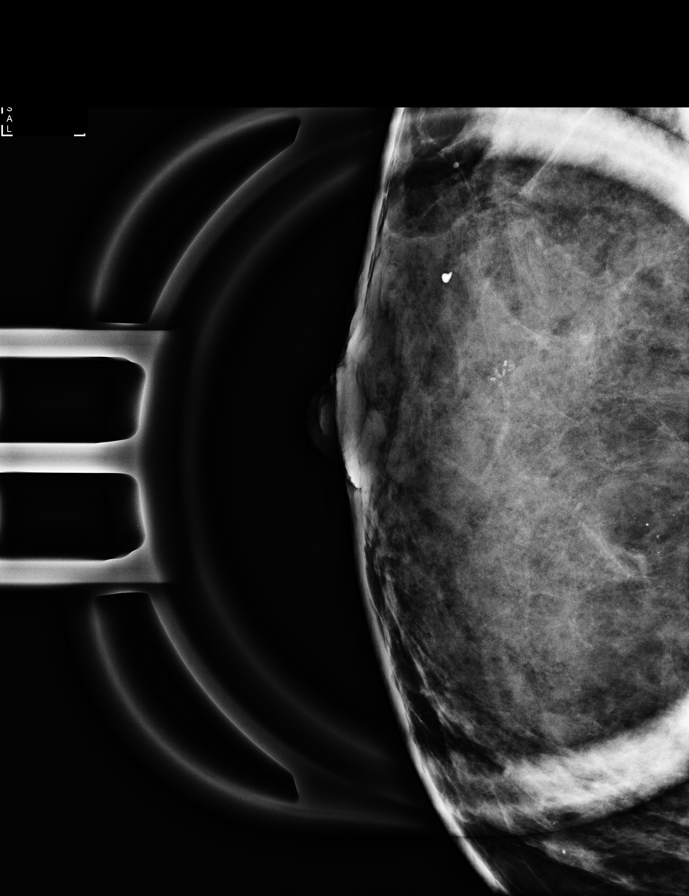

[L ML (2 of 3)]
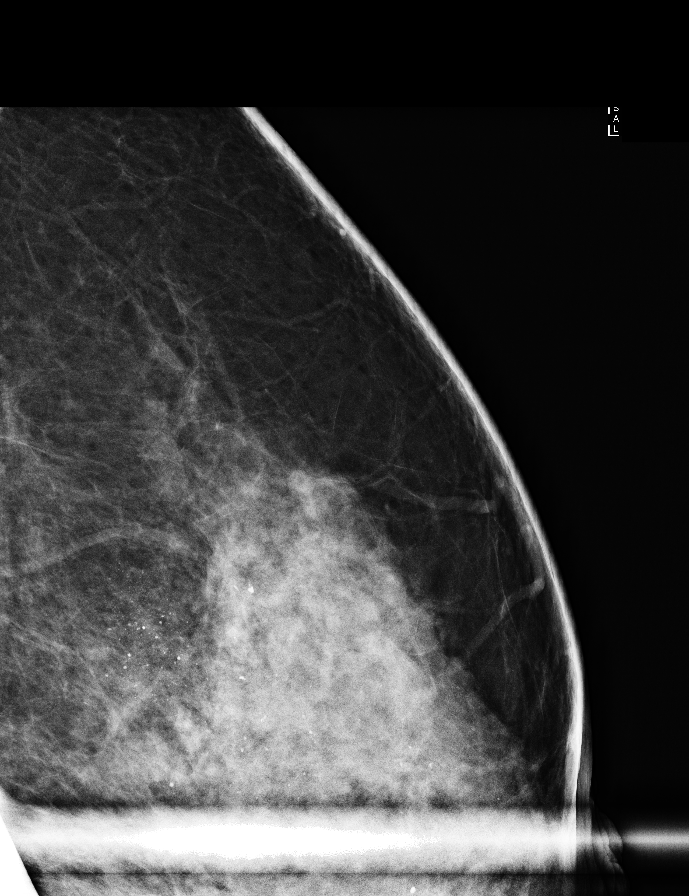

[L ML (3 of 3)]
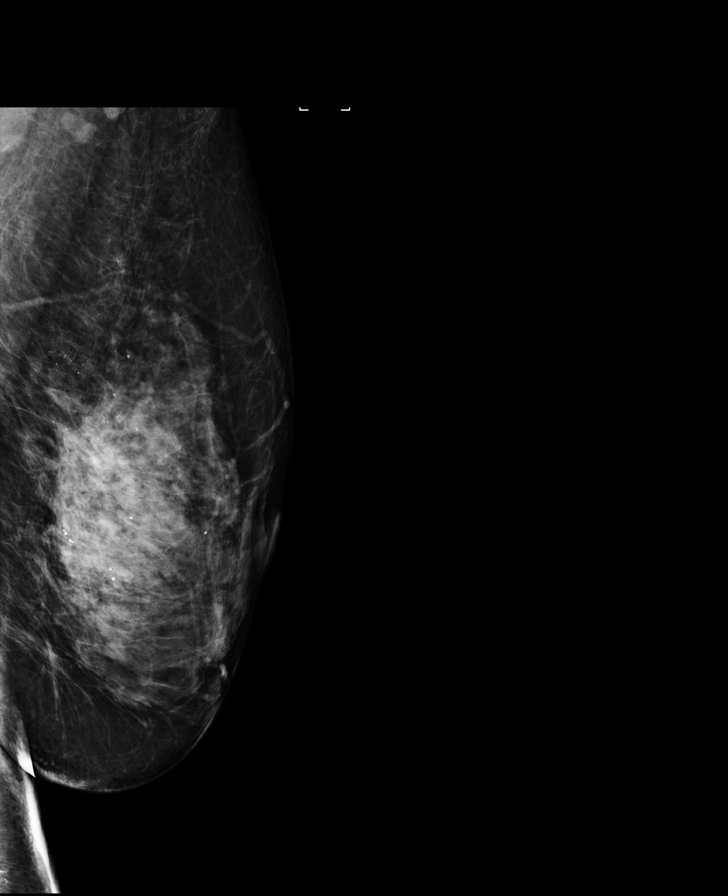

[R CC (2 of 2)]
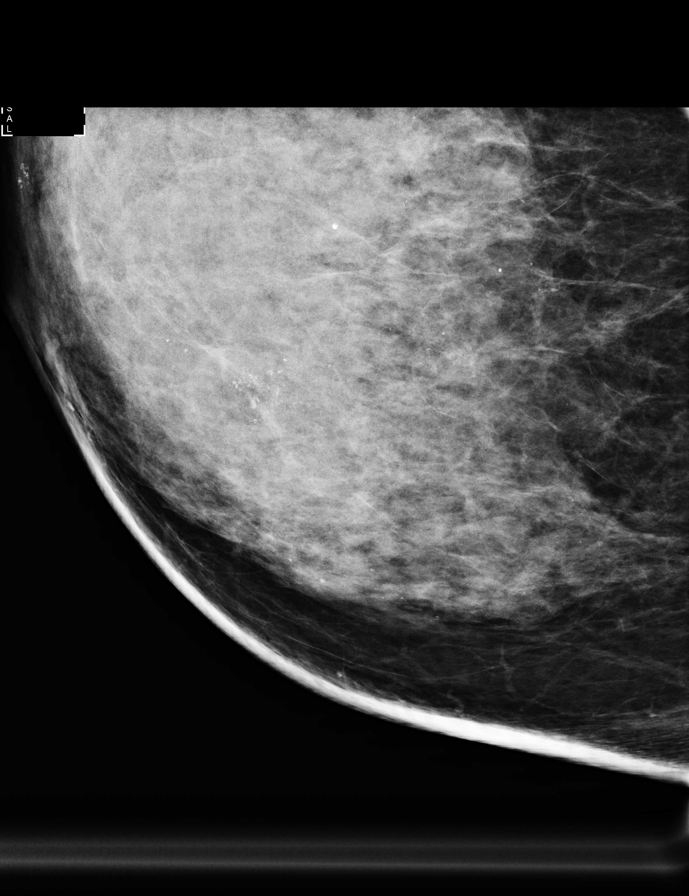

[L CC (2 of 2)]
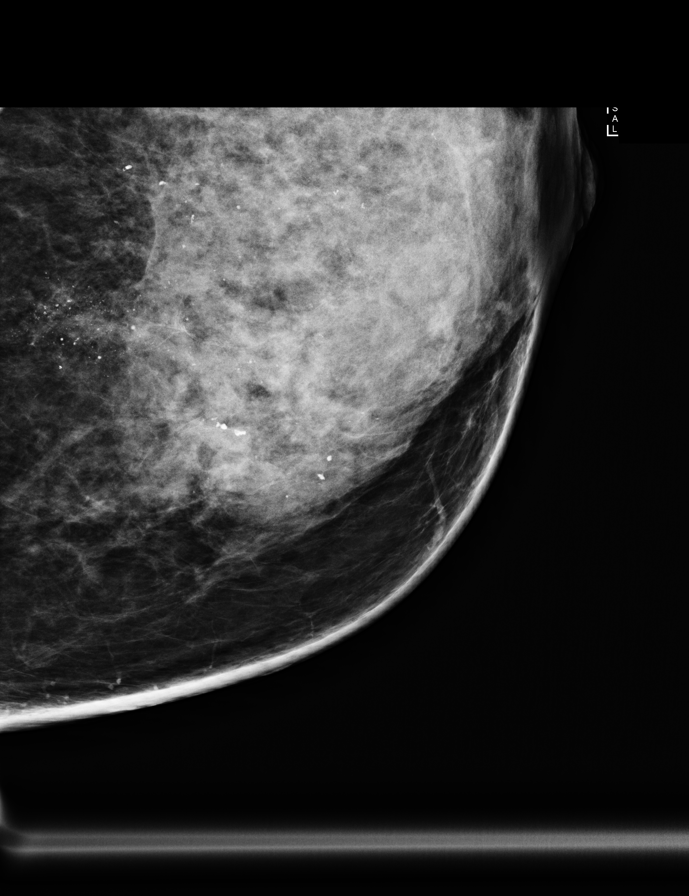

[8 of 10 positions shown; findings below may reference images not displayed]

ACR Breast Density Category d: The breast tissue is extremely dense,
which lowers the sensitivity of mammography.
FINDINGS: Spot compression magnification images of the bilateral breast
demonstrates multiple bilateral scattered and occasionally grouped
calcifications. There is a more concentrated group of calcifications
the medial right breast spanning 1.1 cm, and a 0.4 cm group
superficial calcifications in the retroareolar right breast. The
most concentrated group in the left breast is in the upper outer
quadrant, spanning approximately 5.6 cm. Calcifications in the
bilateral breasts are similar in appearance, and are likely benign.
IMPRESSION: Bilateral likely benign calcifications.

RECOMMENDATION:
Six-month follow-up bilateral diagnostic mammogram.

I have discussed the findings and recommendations with the patient.
If applicable, a reminder letter will be sent to the patient
regarding the next appointment.

BI-RADS CATEGORY  3: Probably benign.
